# Patient Record
Sex: Female | Born: 1980
Health system: Southern US, Community
[De-identification: ages and names within clinical notes are randomized; demographics above are authoritative.]

## PROBLEM LIST (undated history)

## (undated) DIAGNOSIS — Z789 Other specified health status: Secondary | ICD-10-CM

## (undated) HISTORY — PX: NO PAST SURGERIES: SHX2092

---

## 2019-06-19 DIAGNOSIS — O009 Unspecified ectopic pregnancy without intrauterine pregnancy: Secondary | ICD-10-CM

## 2019-06-19 HISTORY — DX: Unspecified ectopic pregnancy without intrauterine pregnancy: O00.90

## 2019-06-26 ENCOUNTER — Other Ambulatory Visit: Payer: Self-pay

## 2019-06-26 ENCOUNTER — Encounter (HOSPITAL_COMMUNITY): Payer: Self-pay | Admitting: Emergency Medicine

## 2019-06-26 ENCOUNTER — Inpatient Hospital Stay (HOSPITAL_COMMUNITY)
Admission: EM | Admit: 2019-06-26 | Discharge: 2019-06-26 | Disposition: A | Discharge status: Home / Self Care | Payer: 59 | Attending: Obstetrics & Gynecology | Admitting: Obstetrics & Gynecology

## 2019-06-26 ENCOUNTER — Inpatient Hospital Stay (HOSPITAL_COMMUNITY): Payer: 59

## 2019-06-26 DIAGNOSIS — O4690 Antepartum hemorrhage, unspecified, unspecified trimester: Secondary | ICD-10-CM | POA: Diagnosis present

## 2019-06-26 DIAGNOSIS — A599 Trichomoniasis, unspecified: Secondary | ICD-10-CM | POA: Insufficient documentation

## 2019-06-26 DIAGNOSIS — Z3A00 Weeks of gestation of pregnancy not specified: Secondary | ICD-10-CM | POA: Diagnosis not present

## 2019-06-26 DIAGNOSIS — O00201 Right ovarian pregnancy without intrauterine pregnancy: Secondary | ICD-10-CM | POA: Diagnosis not present

## 2019-06-26 DIAGNOSIS — O98319 Other infections with a predominantly sexual mode of transmission complicating pregnancy, unspecified trimester: Secondary | ICD-10-CM | POA: Insufficient documentation

## 2019-06-26 DIAGNOSIS — Z20822 Contact with and (suspected) exposure to covid-19: Secondary | ICD-10-CM | POA: Diagnosis not present

## 2019-06-26 DIAGNOSIS — A5901 Trichomonal vulvovaginitis: Secondary | ICD-10-CM | POA: Diagnosis not present

## 2019-06-26 DIAGNOSIS — O00211 Right ovarian pregnancy with intrauterine pregnancy: Secondary | ICD-10-CM | POA: Insufficient documentation

## 2019-06-26 HISTORY — DX: Other specified health status: Z78.9

## 2019-06-26 LAB — I-STAT BETA HCG BLOOD, ED (MC, WL, AP ONLY): I-stat hCG, quantitative: 1754.1 m[IU]/mL — ABNORMAL HIGH (ref <5)

## 2019-06-26 LAB — CBC
HCT: 45.6 % (ref 36.0–46.0)
Hemoglobin: 15.4 g/dL — ABNORMAL HIGH (ref 12.0–15.0)
MCH: 32.5 pg (ref 26.0–34.0)
MCHC: 33.8 g/dL (ref 30.0–36.0)
MCV: 96.2 fL (ref 80.0–100.0)
Platelets: 283 10*3/uL (ref 150–400)
RBC: 4.74 MIL/uL (ref 3.87–5.11)
RDW: 12.5 % (ref 11.5–15.5)
WBC: 8.8 10*3/uL (ref 4.0–10.5)
nRBC: 0 % (ref 0.0–0.2)

## 2019-06-26 LAB — HEPATIC FUNCTION PANEL
ALT: 14 U/L (ref 0–44)
AST: 19 U/L (ref 15–41)
Albumin: 4.5 g/dL (ref 3.5–5.0)
Alkaline Phosphatase: 36 U/L — ABNORMAL LOW (ref 38–126)
Bilirubin, Direct: 0.1 mg/dL (ref 0.0–0.2)
Indirect Bilirubin: 0.5 mg/dL (ref 0.3–0.9)
Total Bilirubin: 0.6 mg/dL (ref 0.3–1.2)
Total Protein: 7.5 g/dL (ref 6.5–8.1)

## 2019-06-26 LAB — BASIC METABOLIC PANEL
Anion gap: 8 (ref 5–15)
BUN: 17 mg/dL (ref 6–20)
CO2: 25 mmol/L (ref 22–32)
Calcium: 9.2 mg/dL (ref 8.9–10.3)
Chloride: 107 mmol/L (ref 98–111)
Creatinine, Ser: 0.74 mg/dL (ref 0.44–1.00)
GFR calc Af Amer: >60 mL/min (ref >60)
GFR calc non Af Amer: >60 mL/min (ref >60)
Glucose, Bld: 99 mg/dL (ref 70–99)
Potassium: 4.2 mmol/L (ref 3.5–5.1)
Sodium: 140 mmol/L (ref 135–145)

## 2019-06-26 LAB — URINALYSIS, ROUTINE W REFLEX MICROSCOPIC
Bacteria, UA: NONE SEEN
Bilirubin Urine: NEGATIVE
Glucose, UA: NEGATIVE mg/dL
Ketones, ur: NEGATIVE mg/dL
Leukocytes,Ua: NEGATIVE
Nitrite: NEGATIVE
Protein, ur: NEGATIVE mg/dL
Specific Gravity, Urine: 1.008 (ref 1.005–1.030)
pH: 5 (ref 5.0–8.0)

## 2019-06-26 LAB — WET PREP, GENITAL
Clue Cells Wet Prep HPF POC: NONE SEEN
Sperm: NONE SEEN
Yeast Wet Prep HPF POC: NONE SEEN

## 2019-06-26 LAB — ABO/RH: ABO/RH(D): O POS

## 2019-06-26 LAB — RESPIRATORY PANEL BY RT PCR (FLU A&B, COVID)
Influenza A by PCR: NEGATIVE
Influenza B by PCR: NEGATIVE
SARS Coronavirus 2 by RT PCR: NEGATIVE

## 2019-06-26 LAB — PREGNANCY, URINE: Preg Test, Ur: POSITIVE — AB

## 2019-06-26 MED ORDER — OXYCODONE-ACETAMINOPHEN 2.5-325 MG PO TABS: 1.0000 | ORAL_TABLET | ORAL | 0 refills | Status: DC | PRN

## 2019-06-26 MED ORDER — METHOTREXATE FOR ECTOPIC PREGNANCY
50.0000 mg/m2 | Freq: Once | INTRAMUSCULAR | Status: AC
  Administered 2019-06-26: 95 mg via INTRAMUSCULAR
  Filled 2019-06-26: qty 1

## 2019-06-26 MED ORDER — SODIUM CHLORIDE 0.9 % IV BOLUS
1000.0000 mL | Freq: Once | INTRAVENOUS | Status: AC
  Administered 2019-06-26: 13:00:00 1000 mL via INTRAVENOUS

## 2019-06-26 MED ORDER — KETOROLAC TROMETHAMINE 30 MG/ML IJ SOLN
30.0000 mg | Freq: Once | INTRAMUSCULAR | Status: DC
  Filled 2019-06-26: qty 1

## 2019-06-26 MED ORDER — KETOROLAC TROMETHAMINE 30 MG/ML IJ SOLN
30.0000 mg | Freq: Once | INTRAMUSCULAR | Status: AC
  Administered 2019-06-26: 16:00:00 30 mg via INTRAMUSCULAR

## 2019-06-26 MED ORDER — METRONIDAZOLE 500 MG PO TABS: 2000.0000 mg | ORAL_TABLET | Freq: Once | ORAL | 0 refills | Status: AC

## 2019-06-26 NOTE — Discharge Instructions (Signed)
Methotrexate Treatment for an Ectopic Pregnancy, Care After This sheet gives you information about how to care for yourself after your procedure. Your health care provider may also give you more specific instructions. If you have problems or questions, contact your health care provider. What can I expect after the procedure? After the procedure, it is common to have:  Abdominal cramping.  Vaginal bleeding.  Fatigue.  Nausea.  Vomiting.  Diarrhea. Blood tests will be taken at timed intervals for several days or weeks to check your pregnancy hormone levels. The blood tests will be done until the pregnancy hormone can no longer be detected in the blood. Follow these instructions at home: Activity  Do not have sex until your health care provider approves.  Limit activities that take a lot of effort as told by your health care provider. Medicines  Take over the counter and prescription medicines only as told by your health care provider.  Do not take aspirin, ibuprofen, naproxen, or any other NSAIDs.  Do not take folic acid, prenatal vitamins, or other vitamins that contain folic acid. General instructions   Do not drink alcohol.  Follow instructions from your health care provider on how and when to report any symptoms that may indicate a ruptured ectopic pregnancy.  Keep all follow-up visits as told by your health care provider. This is important. Contact a health care provider if:  You have persistent nausea and vomiting.  You have persistent diarrhea.  You are having a reaction to the medicine, such as: ? Tiredness. ? Skin rash. ? Hair loss. Get help right away if:  Your abdominal or pelvic pain gets worse.  You have more vaginal bleeding.  You feel light-headed or you faint.  You have shortness of breath.  Your heart rate increases.  You develop a cough.  You have chills.  You have a fever. Summary  After the procedure, it is common to have symptoms  of abdominal cramping, vaginal bleeding and fatigue. You may also experience other symptoms.  Blood tests will be taken at timed intervals for several days or weeks to check your pregnancy hormone levels. The blood tests will be done until the pregnancy hormone can no longer be detected in the blood.  Limit strenuous activity as told by your health care provider.  Follow instructions from your health care provider on how and when to report any symptoms that may indicate a ruptured ectopic pregnancy. This information is not intended to replace advice given to you by your health care provider. Make sure you discuss any questions you have with your health care provider. Document Revised: 01/17/2017 Document Reviewed: 03/26/2016 Elsevier Patient Education  2020 Elsevier Inc.  

## 2019-06-26 NOTE — ED Notes (Signed)
ED TO INPATIENT HANDOFF REPORT  ED Nurse Name and Phone #: [phone]  S Name/Age/Gender Christy Hickman 39 y.o. female Room/Bed: WA15/WA15  Code Status   Code Status: Not on file  Home/SNF/Other Home Patient oriented to: self, place, time and situation Is this baseline? Yes   Triage Complete: Triage complete  Chief Complaint confirmation of pregnancy  Triage Note Per pt, states her last menstrual cycle was on 3/17, states she had sex on 3/27-states she missed period, took pregnancy test which was positive on 4/17-states she went to have an abortion and they could not find fetus-states she slowly started bleeding and went to [facility], had [place] and they could not see fetus-increased lower abdominal/pelvic pain-was told to come to ED for possible ectopic pregnancy    Allergies No Known Allergies  Level of Care/Admitting Diagnosis ED Disposition    ED Disposition Condition Comment   Transfer to Another Facility  The patient appears reasonably stabilized for transfer considering the current resources, flow, and capabilities available in the ED at this time, and I doubt any other [facility] requiring further screening and/or treatment in the ED prior to transfer is p resent.       B Medical/Surgery History History reviewed. No pertinent past medical history. History reviewed. No pertinent surgical history.   A IV Location/Drains/Wounds Patient Lines/Drains/Airways Status   Active Line/Drains/Airways    Name:   Placement date:   Placement time:   Site:   Days:   Peripheral IV 06/26/19 Left Antecubital   06/26/19    1032    Antecubital   less than 1          Intake/Output Last 24 hours No intake or output data in the 24 hours ending 06/26/19 1223  Labs/Imaging Results for orders placed or performed during the hospital encounter of 06/26/19 (from the past 48 hour(s))  Urinalysis, Routine w reflex microscopic     Status: Abnormal   Collection Time: 06/26/19 10:31 AM   Result Value Ref Range   Color, Urine STRAW (A) YELLOW   APPearance CLEAR CLEAR   Specific Gravity, Urine 1.008 1.005 - 1.030   pH 5.0 5.0 - 8.0   Glucose, UA NEGATIVE NEGATIVE mg/dL   Hgb urine dipstick LARGE (A) NEGATIVE   Bilirubin Urine NEGATIVE NEGATIVE   Ketones, ur NEGATIVE NEGATIVE mg/dL   Protein, ur NEGATIVE NEGATIVE mg/dL   Nitrite NEGATIVE NEGATIVE   Leukocytes,Ua NEGATIVE NEGATIVE   RBC / HPF 21-50 0 - 5 RBC/hpf   WBC, UA 0-5 0 - 5 WBC/hpf   Bacteria, UA NONE SEEN NONE SEEN   Squamous Epithelial / LPF 0-5 0 - 5   Mucus PRESENT     Comment: Performed at [facility], [address] [place], [place] [address]  Pregnancy, urine     Status: Abnormal   Collection Time: 06/26/19 10:31 AM  Result Value Ref Range   Preg Test, Ur POSITIVE (A) NEGATIVE    Comment:        THE SENSITIVITY OF THIS METHODOLOGY IS >20 mIU/mL. Performed at [facility], [address] [place], [place] [address]   CBC     Status: Abnormal   Collection Time: 06/26/19 10:31 AM  Result Value Ref Range   WBC 8.8 4.0 - 10.5 K/uL   RBC 4.74 3.87 - 5.11 MIL/uL   Hemoglobin 15.4 (H) 12.0 - 15.0 g/dL   HCT 49.7 02.6 - 37.8 %   MCV 96.2 80.0 - 100.0 fL  MCH 32.5 26.0 - 34.0 pg   MCHC 33.8 30.0 - 36.0 g/dL   RDW 03.5 00.9 - 38.1 %   Platelets 283 150 - 400 K/uL   nRBC 0.0 0.0 - 0.2 %    Comment: Performed at [facility], [address] [place], [place] [address]  Basic metabolic panel     Status: None   Collection Time: 06/26/19 10:31 AM  Result Value Ref Range   Sodium 140 135 - 145 mmol/L   Potassium 4.2 3.5 - 5.1 mmol/L   Chloride 107 98 - 111 mmol/L   CO2 25 22 - 32 mmol/L   Glucose, Bld 99 70 - 99 mg/dL    Comment: Glucose reference range applies only to samples taken after fasting for at least 8 hours.   BUN 17 6 - 20 mg/dL   Creatinine, Ser 7.16 0.44 - 1.00 mg/dL   Calcium 9.2 8.9 - 96.7 mg/dL   GFR calc non Af Amer >60 >60  mL/min   GFR calc Af Amer >60 >60 mL/min   Anion gap 8 5 - 15    Comment: Performed at [facility], [address], [place], [place] [address]  I-Stat beta hCG blood, ED     Status: Abnormal   Collection Time: 06/26/19 10:36 AM  Result Value Ref Range   I-stat hCG, quantitative 1,754.1 (H) <5 mIU/mL   Comment 3            Comment:   GEST. AGE      CONC.  (mIU/mL)   <=1 WEEK        5 - 50     2 WEEKS       50 - 500     3 WEEKS       100 - 10,000     4 WEEKS     1,000 - 30,000        FEMALE AND NON-PREGNANT FEMALE:     LESS THAN 5 mIU/mL    No results found.  Pending Labs Unresulted Labs (From admission, onward)    Start     Ordered   06/26/19 1122  Hepatic function panel  Add-on,   AD     06/26/19 1121   06/26/19 1122  Respiratory Panel by RT PCR (Flu A&B, Covid) - Nasopharyngeal Swab  (Tier 2 Respiratory Panel by RT PCR (Flu A&B, Covid) (TAT 2 hrs))  Once,   STAT    Question Answer Comment  Is this test for diagnosis or screening Screening   Symptomatic for COVID-19 as defined by CDC No   Hospitalized for COVID-19 No   Admitted to ICU for COVID-19 No   Previously tested for COVID-19 Yes   Resident in a congregate (group) care setting No   Employed in healthcare setting No   Pregnant No   Has patient completed COVID vaccination(s) (2 doses of Pfizer/Moderna 1 dose of [employer]) No      06/26/19 1121   06/26/19 1108  ABO/Rh  Once,   STAT     06/26/19 1107          Vitals/Pain Today's Vitals   06/26/19 0928 06/26/19 0934 06/26/19 1106 06/26/19 1211  BP: 138/87  114/77 100/66  Pulse: 94  75 63  Resp: 18  18 18   Temp: 98.4 F (36.9 C)     TempSrc: Oral     SpO2: 100%  98% 95%  Weight: 79.4 kg     Height:  5\' 7"  (1.702 m)     PainSc:  8       Isolation Precautions No active isolations  Medications Medications - No data to display  Mobility walks Low fall risk   Focused Assessments Possible ectopic  pregnancy.   R Recommendations: See Admitting Provider Note  Report given to:   Additional Notes: n/a

## 2019-06-26 NOTE — MAU Provider Note (Signed)
Patient Christy Hickman is a 39 y.o. [id number]  [redacted]w[redacted]d here with complaints of vaginal bleeding and pain. She reports a positive pregnancy on 06-04-2019. She started bleeding the 18; she had an [place] at [facility] that showed nothing in the uterus and it was recommended that she come back for a follow up US in two weeks. Patient then had follow up [facility] this morning at a [facility] and still nothing in the uterus  But with complaints of cramping and bleeding that had been on-going for two weeks.   She denies fever, SOB, nv, she reports that she feels her vagina hurts her and she has lower abdominal pain. She does not like hospitals and did not want to come to be seen until [facility] told her she had to come to the ED. She went to [facility] and then was transferred here via [facility] at 1330.  History     CSN: [id number]  Arrival date and time: 06/26/19 1340   First Provider Initiated Contact with Patient 06/26/19 1401      Chief Complaint  Patient presents with  . Vaginal Bleeding  . Abdominal Pain   Abdominal Pain This is a new problem. The pain is at a severity of 10/10. The quality of the pain is a sensation of fullness and cramping. The abdominal pain radiates to the back. Pertinent negatives include no constipation, diarrhea, dysuria, fever, nausea or vomiting. The pain is aggravated by being still. The pain is relieved by movement.  Vaginal Bleeding The patient's primary symptoms include vaginal bleeding. This is a chronic problem. The current episode started 1 to 4 weeks ago. The problem occurs intermittently. The problem has been gradually worsening. Associated symptoms include abdominal pain. Pertinent negatives include no constipation, diarrhea, dysuria, fever, nausea or vomiting. The vaginal discharge was bloody. The vaginal bleeding is heavier than menses. She has been passing clots. She has been passing tissue. Nothing aggravates the symptoms. She has tried nothing for the  symptoms.    OB History    Gravida  4   Para  2   Term  2   Preterm      AB  1   Living  2     SAB      TAB  1   Ectopic      Multiple      Live Births              Past Medical History:  Diagnosis Date  . Medical history non-contributory     Past Surgical History:  Procedure Laterality Date  . NO PAST SURGERIES      No family history on file.  Social History   Tobacco Use  . Smoking status: Not on file  Substance Use Topics  . Alcohol use: Not on file  . Drug use: Not on file    Allergies: No Known Allergies  No medications prior to admission.    Review of Systems  Constitutional: Negative for fever.  Gastrointestinal: Positive for abdominal pain. Negative for constipation, diarrhea, nausea and vomiting.  Genitourinary: Positive for vaginal bleeding. Negative for dysuria.   Physical Exam   Blood pressure 123/73, pulse 77, temperature 98.2 F (36.8 C), temperature source Oral, resp. rate 17, height 5\' 7"  (1.702 m), weight 79.4 kg, last menstrual period 05/05/2019, SpO2 100 %.  Physical Exam  Constitutional: She is oriented to person, place, and time. She appears well-developed.  Respiratory: Effort normal.  GI: Soft. There is  abdominal tenderness.  Genitourinary:    Genitourinary Comments: NEFG; trace amount of dark brown blood in the vagina, cervix is non-tender, slight tenderness over left adnexa on exam   Musculoskeletal:        General: Normal range of motion.     Cervical back: Normal range of motion.  Neurological: She is alert and oriented to person, place, and time.  Skin: Skin is warm and dry.    MAU Course  Procedures  MDM -Beta drawn at [facility] is 1745; records reviewed from [facility] visit; lab work and provider notes reviewed from [facility] visit this morning.   -US shows right adnexal mass, concerning for ectopic.  I have independently reviewed the [place] images, which reveal finding of right adnexal mass, concerning for ectopic.  Discussed with Dr. Vevelyn Francois, who agrees that methotrexate is appropriate action.  -Hgb is stable, CMP is normal.  -Discussed with patient the diagnosis of ectopic pregnancy, implications for viability and her future health and mortality and morbidty -Patient agrees to methotrexate; will also take pain medicine.   Assessment and Plan   1. Right ovarian pregnancy without intrauterine pregnancy   2. Vaginal bleeding in pregnancy   3. Trichomoniasis    2. Patient stable for discharge. Clear instructions given on worsening pain, heavier bleeding, and plan to return to MAU. Patient discharged with RX for Flagyl for trich (preferes to take at home as she is anxious to get back to her children).  3. RX for pain meds given, plus stat BHCG appt at [facility] made for Day # 4 labs. Patient knows the time of the appointment and the location and she knows she will have to wait for phone call after labs come back (2 hour turnaround time) 4. Work note written so that she can take this week off for her follow up visits on Tuesday and Friday. She was counseled about possible rise in BHCG between now (Day # 1) and Tuesday (Day #4) and then an expected drop between Day#4 and Day # 7.  She knows she will need follow up after that time.  5. Precautions about abstinence until trich and ectopic pregnancy have been resolved; all questions answered.  Mervyn Skeeters Amyah Clawson 06/26/2019, 3:57 PM

## 2019-06-26 NOTE — ED Notes (Signed)
Called and notified Charge RN Joni Reining that pt will be transferred after coordinating transport with [facility].

## 2019-06-26 NOTE — ED Triage Notes (Signed)
Per pt, states her last menstrual cycle was on 3/17, states she had sex on 3/27-states she missed period, took pregnancy test which was positive on 4/17-states she went to have an abortion and they could not find fetus-states she slowly started bleeding and went to [facility], had [place] and they could not see fetus-increased lower abdominal/pelvic pain-was told to come to ED for possible ectopic pregnancy

## 2019-06-26 NOTE — ED Provider Notes (Signed)
[facility] DEPT Provider Note   CSN: [id number] Arrival date & time: 06/26/19  1340     History Chief Complaint  Patient presents with  . Vaginal Bleeding    Christy Hickman is a 39 y.o. female G4P2 with no significant past medical history presents the ED with complaints of pelvic pain and vaginal bleeding in the setting of pregnancy.  Patient reports that she took a home pregnancy test on 06/05/2019 and was found to be pregnant.  She went to an abortion clinic, but they could not find any intrauterine pregnancy and they suspected that she was "too early" encouraged her to come back on 06/26/2019.  Patient states that since her initial visit, she has been experiencing progressively worsening pelvic/pubic abdominal discomfort in addition to vaginal bleeding.  Upon returning to the clinic as scheduled, they once again were unable to detect intrauterine pregnancy.  However, her UA was once again pregnant and she was advised to come to the ED emergently for ectopic work-up.     HPI     History reviewed. No pertinent past medical history.  There are no problems to display for this patient.   History reviewed. No pertinent surgical history.   OB History   No obstetric history on file.     No family history on file.  Social History   Tobacco Use  . Smoking status: Not on file  Substance Use Topics  . Alcohol use: Not on file  . Drug use: Not on file    Home Medications Prior to Admission medications   Not on File    Allergies    Patient has no known allergies.  Review of Systems   Review of Systems  Constitutional: Negative for chills and fever.  Gastrointestinal: Negative for abdominal pain, nausea and vomiting.  Genitourinary: Positive for pelvic pain, vaginal bleeding and vaginal pain.    Physical Exam Updated Vital Signs BP (!) 95/51   Pulse 77   Temp 98.4 F (36.9 C) (Oral)   Resp 18   Ht 5\' 7"  (1.702 m)   Wt 79.4 kg   SpO2 100%    BMI 27.41 kg/m   Physical Exam Vitals and nursing note reviewed. Exam conducted with a chaperone present.  HENT:     Head: Normocephalic and atraumatic.  Eyes:     General: No scleral icterus.    Conjunctiva/sclera: Conjunctivae normal.  Cardiovascular:     Rate and Rhythm: Normal rate and regular rhythm.     Pulses: Normal pulses.     Heart sounds: Normal heart sounds.  Pulmonary:     Effort: Pulmonary effort is normal. No respiratory distress.     Breath sounds: Normal breath sounds.  Abdominal:     Comments: Soft, nondistended.  TTP in lower abdomen, most notably over suprapubic region.  No overlying skin changes.  Normoactive bowel sounds.  Musculoskeletal:     Cervical back: Normal range of motion and neck supple.     Right lower leg: No edema.     Left lower leg: No edema.  Skin:    General: Skin is dry.     Capillary Refill: Capillary refill takes less than 2 seconds.  Neurological:     Mental Status: She is alert and oriented to person, place, and time.     GCS: GCS eye subscore is 4. GCS verbal subscore is 5. GCS motor subscore is 6.  Psychiatric:        Mood and Affect: Mood normal.  Behavior: Behavior normal.        Thought Content: Thought content normal.     ED Results / Procedures / Treatments   Labs (all labs ordered are listed, but only abnormal results are displayed) Labs Reviewed  URINALYSIS, ROUTINE W REFLEX MICROSCOPIC - Abnormal; Notable for the following components:      Result Value   Color, Urine STRAW (*)    Hgb urine dipstick LARGE (*)    All other components within normal limits  PREGNANCY, URINE - Abnormal; Notable for the following components:   Preg Test, Ur POSITIVE (*)    All other components within normal limits  CBC - Abnormal; Notable for the following components:   Hemoglobin 15.4 (*)    All other components within normal limits  I-STAT BETA HCG BLOOD, ED ([facility], [facility], [facility] ONLY) - Abnormal; Notable for the following components:    I-stat hCG, quantitative 1,754.1 (*)    All other components within normal limits  RESPIRATORY PANEL BY RT PCR (FLU A&B, COVID)  BASIC METABOLIC PANEL  HEPATIC FUNCTION PANEL  ABO/RH    EKG None  Radiology No results found.  Procedures Procedures (including critical care time)  Medications Ordered in ED Medications  sodium chloride 0.9 % bolus 1,000 mL (1,000 mLs Intravenous New Bag/Given 06/26/19 1307)    ED Course  I have reviewed the triage vital signs and the nursing notes.  Pertinent labs & imaging results that were available during my care of the patient were reviewed by me and considered in my medical decision making (see chart for details).  Clinical Course as of Jun 25 1341  Sat Jun 26, 2019  1121 Spoke with Dr. Lavina Hamman OB/GYN who is recommending transfer to [facility] for ultrasound to assess for ectopic pregnancy.  Will add on hepatic function panel as patient will request methotrexate regardless as this is not a desired pregnancy.   [GG]    Clinical Course User Index [GG] Elvera Maria   MDM Rules/Calculators/A&P                      Spoke with Dr. Lavina Hamman OB/GYN who is recommending transfer to [facility] for ultrasound to assess for ectopic pregnancy.  Will add on hepatic function panel as patient will request methotrexate regardless as this is not a desired pregnancy.  Spoke with Lauris Poag, MAU APP who will accept patient to [facility].  Spoke with our nursing staff who will arrange for transport.    [facility] finally arrives for transport.  Patient reassessed and continues to be in no acute distress. Stable for transport to MAU.  Discussed with Dr. Jacqulyn Bath who agrees with assessment and plan.    Final Clinical Impression(s) / ED Diagnoses Final diagnoses:  Vaginal bleeding in pregnancy    Rx / DC Orders ED Discharge Orders    None       Lorelee New, PA-C 06/26/19 1343    Long, Arlyss Repress, MD 06/27/19 [phone]

## 2019-06-26 NOTE — MAU Note (Addendum)
Christy Hickman is a 39 y.o. at [redacted]w[redacted]d here in MAU reporting:  Arrived via [facility] from [facility] ED LMP: 05/05/19. Reports that she had unprotected sex on 05/15/19.  +HPT on 06/05/19 Patient states she wanted to have an abortion and went to [facility] on 06/[facility]. There they did not see anything on ultrasound.  Has been having lower abdominal pain and vaginal bleeding that has continued to increase since 06/06/19. Onset of complaint: 06/06/19 Pain score: 5-6/10 Vitals:   06/26/19 1313 06/26/19 1337  BP: (!) 95/51 123/73  Pulse: 77 77  Resp: 18 17  Temp:  98.2 F (36.8 C)  SpO2: 100% 100%   20G IV noted in Left AC infusing with 0.9% Normal Saline. Lab orders placed from triage: none

## 2019-06-28 LAB — GC/CHLAMYDIA PROBE AMP (~~LOC~~) NOT AT ARMC
Chlamydia: NEGATIVE
Comment: NEGATIVE
Comment: NORMAL
Neisseria Gonorrhea: NEGATIVE

## 2019-06-29 ENCOUNTER — Ambulatory Visit (INDEPENDENT_AMBULATORY_CARE_PROVIDER_SITE_OTHER): Payer: 59 | Admitting: *Deleted

## 2019-06-29 ENCOUNTER — Other Ambulatory Visit: Payer: 59

## 2019-06-29 ENCOUNTER — Other Ambulatory Visit: Payer: Self-pay

## 2019-06-29 DIAGNOSIS — O00101 Right tubal pregnancy without intrauterine pregnancy: Secondary | ICD-10-CM

## 2019-06-29 DIAGNOSIS — O3680X0 Pregnancy with inconclusive fetal viability, not applicable or unspecified: Secondary | ICD-10-CM

## 2019-06-29 DIAGNOSIS — Z113 Encounter for screening for infections with a predominantly sexual mode of transmission: Secondary | ICD-10-CM

## 2019-06-29 LAB — BETA HCG QUANT (REF LAB): hCG Quant: 1480 m[IU]/mL

## 2019-06-29 NOTE — Progress Notes (Signed)
Received results of stat bhcg.Reviewed with Dr.Davis. Called [facility] and notified her of results of 1480 today. Explained with continue with plan of repeat stat bhcg on Friday 07/02/19 which will be day 7 after methrotrexate. Instructed her to go to the hospital if she has severe pain or heavy bleeding. She voices understanding. Dewey Viens,RN

## 2019-06-29 NOTE — Progress Notes (Signed)
Here for Stat Bhcg.  States bleeding stopped mostly on 06/27/19- only having occasional spotting. States still having same intermittent pain in her lower back  Lower pelvis=5  Explained we will draw stat bhcg and have her leave; but we will call her with  Results in about 2 hours after they are reviewed with provider and then call her. She voices understanding. She also asked to have std testing hiv and rpr.  Jetty Berland,RN

## 2019-06-30 LAB — HIV ANTIBODY (ROUTINE TESTING W REFLEX): HIV Screen 4th Generation wRfx: NONREACTIVE

## 2019-06-30 LAB — RPR: RPR Ser Ql: NONREACTIVE

## 2019-06-30 NOTE — Progress Notes (Signed)
I have reviewed this chart and agree with the RN/CMA assessment and management.    K. Meryl Kealey Kemmer, M.D. Attending [facility] (Faculty Practice)   

## 2019-07-01 LAB — SPECIMEN STATUS REPORT

## 2019-07-01 LAB — BETA HCG QUANT (REF LAB): hCG Quant: 1392 m[IU]/mL

## 2019-07-02 ENCOUNTER — Inpatient Hospital Stay (HOSPITAL_COMMUNITY): Payer: 59

## 2019-07-02 ENCOUNTER — Other Ambulatory Visit: Payer: Self-pay

## 2019-07-02 ENCOUNTER — Encounter: Payer: Self-pay | Admitting: *Deleted

## 2019-07-02 ENCOUNTER — Encounter (HOSPITAL_COMMUNITY): Payer: Self-pay | Admitting: Obstetrics & Gynecology

## 2019-07-02 ENCOUNTER — Inpatient Hospital Stay (HOSPITAL_COMMUNITY)
Admission: AD | Admit: 2019-07-02 | Discharge: 2019-07-02 | Disposition: A | Discharge status: Home / Self Care | Payer: 59 | Attending: Obstetrics & Gynecology | Admitting: Obstetrics & Gynecology

## 2019-07-02 ENCOUNTER — Ambulatory Visit (INDEPENDENT_AMBULATORY_CARE_PROVIDER_SITE_OTHER): Payer: 59 | Admitting: *Deleted

## 2019-07-02 VITALS — BP 118/70 | HR 77 | Temp 98.5°F

## 2019-07-02 DIAGNOSIS — Z8759 Personal history of other complications of pregnancy, childbirth and the puerperium: Secondary | ICD-10-CM | POA: Insufficient documentation

## 2019-07-02 DIAGNOSIS — F17210 Nicotine dependence, cigarettes, uncomplicated: Secondary | ICD-10-CM | POA: Insufficient documentation

## 2019-07-02 DIAGNOSIS — G44209 Tension-type headache, unspecified, not intractable: Secondary | ICD-10-CM | POA: Diagnosis not present

## 2019-07-02 DIAGNOSIS — O009 Unspecified ectopic pregnancy without intrauterine pregnancy: Secondary | ICD-10-CM | POA: Insufficient documentation

## 2019-07-02 DIAGNOSIS — O00101 Right tubal pregnancy without intrauterine pregnancy: Secondary | ICD-10-CM

## 2019-07-02 DIAGNOSIS — O3680X0 Pregnancy with inconclusive fetal viability, not applicable or unspecified: Secondary | ICD-10-CM

## 2019-07-02 DIAGNOSIS — O00102 Left tubal pregnancy without intrauterine pregnancy: Secondary | ICD-10-CM

## 2019-07-02 LAB — HCG, QUANTITATIVE, PREGNANCY: hCG, Beta Chain, Quant, S: 637 m[IU]/mL — ABNORMAL HIGH (ref <5)

## 2019-07-02 LAB — CBC
HCT: 41.9 % (ref 36.0–46.0)
Hemoglobin: 13.9 g/dL (ref 12.0–15.0)
MCH: 31.6 pg (ref 26.0–34.0)
MCHC: 33.2 g/dL (ref 30.0–36.0)
MCV: 95.2 fL (ref 80.0–100.0)
Platelets: 287 10*3/uL (ref 150–400)
RBC: 4.4 MIL/uL (ref 3.87–5.11)
RDW: 12.5 % (ref 11.5–15.5)
WBC: 7 10*3/uL (ref 4.0–10.5)
nRBC: 0 % (ref 0.0–0.2)

## 2019-07-02 LAB — BETA HCG QUANT (REF LAB): hCG Quant: 507 m[IU]/mL

## 2019-07-02 MED ORDER — BUTALBITAL-APAP-CAFFEINE 50-325-40 MG PO TABS: 1.0000 | ORAL_TABLET | Freq: Four times a day (QID) | ORAL | 0 refills | Status: AC | PRN

## 2019-07-02 MED ORDER — BUTALBITAL-APAP-CAFFEINE 50-325-40 MG PO TABS
2.0000 | ORAL_TABLET | Freq: Four times a day (QID) | ORAL | Status: DC | PRN
  Administered 2019-07-02: 2 via ORAL
  Filled 2019-07-02: qty 2

## 2019-07-02 NOTE — Progress Notes (Signed)
Chart reviewed - agree with CMA/RN documentation.  ° °

## 2019-07-02 NOTE — Progress Notes (Signed)
Pt here for stat BHCG - Arush Gatliff 7 after MTX. She reports increased abdominal/pelvic pain since MAU visit on 5/8. She called and spoke with midwife on 5/12 and was told to come to hospital. She decided not to go and just continue taking Oxycodone for the pain.  - "to see if I could push through it". Pt states she has only 2 tablets left and requests refill. Today she reports having a severe H/A which is new. She also has pain in her back as well. She took Oxycodone @ 0400 due to H/A - no relief. Stat BHCG drawn. Pt status discussed with Dr. Adrian Blackwater who recommends pt go immediately to MAU for [place] and further evaluation. Pt was advised of recommended plan of care. She voiced understanding and agreed.

## 2019-07-02 NOTE — MAU Note (Signed)
Sent from clinic, today day 7 post MTX. Received on 5/8. Wed had severe pain. Today is not as bad, RLQ and woke up with a bad HA. Has been bleeding since 4/18, bleeding increased on Wed, and had small clots.  Less bleeding today. Feeling bloated, pressure.  Took oxycodone at 0400.

## 2019-07-02 NOTE — MAU Provider Note (Addendum)
History     CSN: [id number]  Arrival date and time: 07/02/19 1034   First Provider Initiated Contact with Patient 07/02/19 1125      Chief Complaint  Patient presents with  . Headache  . Abdominal Pain   39 y.o. [id number] with known right ectopic s/p MTX 7 days ago presenting with pain. Reports 2 episodes of severe abdominal pain 2 days ago. She took 2 Oxycodone and pain improved. She also had nausea, SOB, and lightheadedness at that time. The pain improved yesterday and today rates it 5/10. Reports using 1 Oxycodone q4 hours. Also c/o frontal and occipital HA that started this am. She took Oxycodone but it didn't help her HA. No nausea or visual changes. She is having light VB.   OB History    Gravida  4   Para  2   Term  2   Preterm      AB  1   Living  2     SAB      TAB  1   Ectopic      Multiple      Live Births              Past Medical History:  Diagnosis Date  . Ectopic pregnancy 06/2019   Methotrexate  . Medical history non-contributory     Past Surgical History:  Procedure Laterality Date  . NO PAST SURGERIES      Family History  Problem Relation Age of Onset  . Healthy Mother   . Healthy Father     Social History   Tobacco Use  . Smoking status: Current Every Day Smoker    Packs/day: 0.50    Years: 24.00    Pack years: 12.00    Types: Cigarettes  . Smokeless tobacco: Never Used  Substance Use Topics  . Alcohol use: Not Currently  . Drug use: Never    Allergies: No Known Allergies  No medications prior to admission.    Review of Systems  Constitutional: Negative for fever.  Eyes: Negative for visual disturbance.  Gastrointestinal: Positive for abdominal pain. Negative for nausea and vomiting.  Genitourinary: Positive for vaginal bleeding.  Neurological: Positive for headaches. Negative for dizziness and light-headedness.   Physical Exam   Blood pressure 99/63, pulse 61, temperature 99.4 F (37.4 C), temperature  source Oral, resp. rate 16, height 5\' 7"  (1.702 m), weight 80.2 kg, last menstrual period 05/05/2019, SpO2 99 %.  Physical Exam  Nursing note and vitals reviewed. Constitutional: She is oriented to person, place, and time. She appears well-developed and well-nourished. No distress.  HENT:  Head: Normocephalic and atraumatic.  Cardiovascular: Normal rate.  Respiratory: Effort normal. No respiratory distress.  GI: Soft. She exhibits no distension and no mass. There is abdominal tenderness (mild) in the periumbilical area. There is no rebound and no guarding.  Musculoskeletal:        General: Normal range of motion.     Cervical back: Normal range of motion.  Neurological: She is alert and oriented to person, place, and time.  Skin: Skin is warm and dry.  Psychiatric: She has a normal mood and affect.   Results for orders placed or performed during the hospital encounter of 07/02/19 (from the past 24 hour(s))  CBC     Status: None   Collection Time: 07/02/19 11:00 AM  Result Value Ref Range   WBC 7.0 4.0 - 10.5 K/uL   RBC 4.40 3.87 - 5.11 MIL/uL   Hemoglobin 13.9  12.0 - 15.0 g/dL   HCT 41.9 36.0 - 46.0 %   MCV 95.2 80.0 - 100.0 fL   MCH 31.6 26.0 - 34.0 pg   MCHC 33.2 30.0 - 36.0 g/dL   RDW 12.5 11.5 - 15.5 %   Platelets 287 150 - 400 K/uL   nRBC 0.0 0.0 - 0.2 %  hCG, quantitative, pregnancy     Status: Abnormal   Collection Time: 07/02/19 11:00 AM  Result Value Ref Range   hCG, Beta Chain, Quant, S 637 (H) <5 mIU/mL   US OB Transvaginal  Result Date: 07/02/2019 CLINICAL DATA:  Possible ectopic pregnancy. EXAM: TRANSVAGINAL OB ULTRASOUND TECHNIQUE: Transvaginal ultrasound was performed for complete evaluation of the gestation as well as the maternal uterus, adnexal regions, and pelvic cul-de-sac. COMPARISON:  Jun 26, 2019. FINDINGS: Intrauterine gestational sac: None Yolk sac:  Not Visualized. Embryo:  Not Visualized. Cardiac Activity: Not Visualized. Subchorionic hemorrhage:  None  visualized. Maternal uterus/adnexae: Ovaries are unremarkable. Trace free fluid is noted. Heterogeneous mass is seen adjacent to right ovary that measures 2.3 x 2.0 cm by my own measurement and appears to be slightly enlarged compared to prior exam. It does not demonstrate significantly increased vascularity on Doppler. IMPRESSION: No evidence of intrauterine gestation is noted. 2.3 x 2.0 cm heterogeneous right adnexal mass is seen adjacent to right ovary which may be slightly enlarged compared to prior exam. This remains suspicious for ectopic pregnancy. Reportedly the patient has already received methotrexate therapy. A significant amount of pelvic free fluid is not identified. These results were called by telephone at the time of interpretation on 07/02/2019 at 12:02 pm to provider [facility] , who verbally acknowledged these results. Electronically Signed   By: Marijo Conception M.D.   On: 07/02/2019 12:03   MAU Course  Procedures Meds ordered this encounter  Medications  . butalbital-acetaminophen-caffeine (FIORICET) 50-325-40 MG per tablet 2 tablet  . butalbital-acetaminophen-caffeine (FIORICET) 50-325-40 MG tablet    Sig: Take 1-2 tablets by mouth every 6 (six) hours as needed for headache.    Dispense:  14 tablet    Refill:  0    Order Specific Question:   Supervising Provider    Answer:   Woodroe Mode [[id number]]   MDM Labs and [place] ordered and reviewed. No evidence of ectopic rupture, and qhcg dropping appropriately. Discussed hx, presentation, and findings with Dr. Roselie Awkward, plan for routine f/u in 1 week. HA improved, likely tension. Pt requesting another Oxycodone Rx which I do not recommend and informed pt if pain is severe she would need to be evaluated as she is still at risk for rupture. Stable for discharge home.   Assessment and Plan  Right ectopic pregnancy Tension headache Discharge home Follow up at [facility] in 1 week for qhcg- message sent to schedule Return precautions Rx  Fioricet  Allergies as of 07/02/2019   No Known Allergies     Medication List    STOP taking these medications   oxycodone-acetaminophen 2.5-325 MG tablet Commonly known as: Percocet     TAKE these medications   butalbital-acetaminophen-caffeine 50-325-40 MG tablet Commonly known as: FIORICET Take 1-2 tablets by mouth every 6 (six) hours as needed for headache.      Julianne Handler, CNM 07/02/2019, 4:05 PM

## 2019-07-02 NOTE — Discharge Instructions (Signed)
Methotrexate Treatment for an Ectopic Pregnancy, Care After This sheet gives you information about how to care for yourself after your procedure. Your health care provider may also give you more specific instructions. If you have problems or questions, contact your health care provider. What can I expect after the procedure? After the procedure, it is common to have:  Abdominal cramping.  Vaginal bleeding.  Fatigue.  Nausea.  Vomiting.  Diarrhea. Blood tests will be taken at timed intervals for several days or weeks to check your pregnancy hormone levels. The blood tests will be done until the pregnancy hormone can no longer be detected in the blood. Follow these instructions at home: Activity  Do not have sex until your health care provider approves.  Limit activities that take a lot of effort as told by your health care provider. Medicines  Take over the counter and prescription medicines only as told by your health care provider.  Do not take aspirin, ibuprofen, naproxen, or any other NSAIDs.  Do not take folic acid, prenatal vitamins, or other vitamins that contain folic acid. General instructions   Do not drink alcohol.  Follow instructions from your health care provider on how and when to report any symptoms that may indicate a ruptured ectopic pregnancy.  Keep all follow-up visits as told by your health care provider. This is important. Contact a health care provider if:  You have persistent nausea and vomiting.  You have persistent diarrhea.  You are having a reaction to the medicine, such as: ? Tiredness. ? Skin rash. ? Hair loss. Get help right away if:  Your abdominal or pelvic pain gets worse.  You have more vaginal bleeding.  You feel light-headed or you faint.  You have shortness of breath.  Your heart rate increases.  You develop a cough.  You have chills.  You have a fever. Summary  After the procedure, it is common to have symptoms  of abdominal cramping, vaginal bleeding and fatigue. You may also experience other symptoms.  Blood tests will be taken at timed intervals for several days or weeks to check your pregnancy hormone levels. The blood tests will be done until the pregnancy hormone can no longer be detected in the blood.  Limit strenuous activity as told by your health care provider.  Follow instructions from your health care provider on how and when to report any symptoms that may indicate a ruptured ectopic pregnancy. This information is not intended to replace advice given to you by your health care provider. Make sure you discuss any questions you have with your health care provider. Document Revised: 01/17/2017 Document Reviewed: 03/26/2016 Elsevier Patient Education  2020 Elsevier Inc.   Tension Headache, Adult A tension headache is pain, pressure, or aching in your head. Tension headaches can last from 30 minutes to several days. Follow these instructions at home: Managing pain  Take over-the-counter and prescription medicines only as told by your doctor.  When you have a headache, lie down in a dark, quiet room.  If told, put ice on your head and neck: ? Put ice in a plastic bag. ? Place a towel between your skin and the bag. ? Leave the ice on for 20 minutes, 2-3 times a day.  If told, put heat on the back of your neck. Do this as often as your doctor tells you to. Use the kind of heat that your doctor recommends, such as a moist heat pack or a heating pad. ? Place a towel  between your skin and the heat. ? Leave the heat on for 20-30 minutes. ? Remove the heat if your skin turns bright red. Eating and drinking  Eat meals on a regular schedule.  Watch how much alcohol you drink: ? If you are a woman and are not pregnant, do not drink more than 1 drink a day. ? If you are a man, do not drink more than 2 drinks a day.  Drink enough fluid to keep your pee (urine) pale yellow.  Do not use a  lot of caffeine, or stop using caffeine. Lifestyle  Get enough sleep. Get 7-9 hours of sleep each night. Or get the amount of sleep that your doctor tells you to.  At bedtime, remove all electronic devices from your room. Examples of electronic devices are computers, phones, and tablets.  Find ways to lessen your stress. Some things that can lessen stress are: ? Exercise. ? Deep breathing. ? Yoga. ? Music. ? Positive thoughts.  Sit up straight. Do not tighten (tense) your muscles.  Do not use any products that have nicotine or tobacco in them, such as cigarettes and e-cigarettes. If you need help quitting, ask your doctor. General instructions   Keep all follow-up visits as told by your doctor. This is important.  Avoid things that can bring on headaches. Keep a journal to find out if certain things bring on headaches. For example, write down: ? What you eat and drink. ? How much sleep you get. ? Any change to your diet or medicines. Contact a doctor if:  Your headache does not get better.  Your headache comes back.  You have a headache and sounds, light, or smells bother you.  You feel sick to your stomach (nauseous) or you throw up (vomit).  Your stomach hurts. Get help right away if:  You suddenly get a very bad headache along with any of these: ? A stiff neck. ? Feeling sick to your stomach. ? Throwing up. ? Feeling weak. ? Trouble seeing. ? Feeling short of breath. ? A rash. ? Feeling unusually sleepy. ? Trouble speaking. ? Pain in your eye or ear. ? Trouble walking or balancing. ? Feeling like you will pass out (faint). ? Passing out. Summary  A tension headache is pain, pressure, or aching in your head.  Tension headaches can last from 30 minutes to several days.  Lifestyle changes and medicines may help relieve pain. This information is not intended to replace advice given to you by your health care provider. Make sure you discuss any questions you  have with your health care provider. Document Revised: 12/02/2018 Document Reviewed: 05/17/2016 Elsevier Patient Education  [place].

## 2019-07-05 ENCOUNTER — Telehealth: Payer: Self-pay | Admitting: Family Medicine

## 2019-07-05 NOTE — Telephone Encounter (Signed)
Attempted to contact patient with her lab work appointments. No answer, left voicemail with the appointment information. Patient instructed to give the office a call back if she needs to reschedule.

## 2019-07-08 ENCOUNTER — Telehealth (INDEPENDENT_AMBULATORY_CARE_PROVIDER_SITE_OTHER): Payer: 59 | Admitting: Lactation Services

## 2019-07-08 DIAGNOSIS — O00109 Unspecified tubal pregnancy without intrauterine pregnancy: Secondary | ICD-10-CM

## 2019-07-08 NOTE — Telephone Encounter (Signed)
Called patient to give HCG results and follow up recommendations of weekly Hcg appointments. Patient did not answer. Asked patient to check My chart and to make sure she calls the office for information.

## 2019-07-09 ENCOUNTER — Other Ambulatory Visit: Payer: Self-pay | Admitting: General Practice

## 2019-07-09 ENCOUNTER — Other Ambulatory Visit: Payer: Self-pay

## 2019-07-09 ENCOUNTER — Other Ambulatory Visit: Payer: 59

## 2019-07-09 DIAGNOSIS — O00101 Right tubal pregnancy without intrauterine pregnancy: Secondary | ICD-10-CM

## 2019-07-10 LAB — BETA HCG QUANT (REF LAB): hCG Quant: 80 m[IU]/mL

## 2019-07-12 ENCOUNTER — Telehealth (INDEPENDENT_AMBULATORY_CARE_PROVIDER_SITE_OTHER): Payer: 59 | Admitting: Lactation Services

## 2019-07-12 DIAGNOSIS — O00101 Right tubal pregnancy without intrauterine pregnancy: Secondary | ICD-10-CM

## 2019-07-12 NOTE — Telephone Encounter (Signed)
-----   Message from Donette Larry, [place] sent at 07/12/2019  8:30 AM EDT ----- Good drop. Needs weekly qhcg until negative.

## 2019-07-12 NOTE — Telephone Encounter (Signed)
Patient called and informed of HCG levels. Patient aware she will need to come in weekly until results get to 0. Patient voiced understanding and will be in on Friday for herr next Quant.

## 2019-07-15 ENCOUNTER — Other Ambulatory Visit: Payer: Self-pay | Admitting: *Deleted

## 2019-07-15 DIAGNOSIS — O00109 Unspecified tubal pregnancy without intrauterine pregnancy: Secondary | ICD-10-CM

## 2019-07-16 ENCOUNTER — Other Ambulatory Visit: Payer: Self-pay

## 2019-07-16 ENCOUNTER — Other Ambulatory Visit: Payer: 59

## 2019-07-16 DIAGNOSIS — O00109 Unspecified tubal pregnancy without intrauterine pregnancy: Secondary | ICD-10-CM

## 2019-07-17 LAB — BETA HCG QUANT (REF LAB): hCG Quant: 14 m[IU]/mL

## 2019-07-20 ENCOUNTER — Telehealth: Payer: Self-pay | Admitting: Lactation Services

## 2019-07-20 NOTE — Telephone Encounter (Signed)
Called patient to inform her of her most recent Beta Hcg and to follow up with blood draw on 6/4 at 10:30. Patient did not answer. LM for patient to call the office for results. Will send My Chart message also.

## 2019-07-20 NOTE — Telephone Encounter (Signed)
-----   Message from Donette Larry, [place] sent at 07/17/2019  9:25 AM EDT ----- Good drop, continue weekly qhcg

## 2019-07-22 ENCOUNTER — Other Ambulatory Visit: Payer: Self-pay | Admitting: *Deleted

## 2019-07-22 DIAGNOSIS — O00101 Right tubal pregnancy without intrauterine pregnancy: Secondary | ICD-10-CM

## 2019-07-23 ENCOUNTER — Other Ambulatory Visit: Payer: 59

## 2019-07-23 ENCOUNTER — Other Ambulatory Visit: Payer: Self-pay

## 2019-07-23 DIAGNOSIS — O00101 Right tubal pregnancy without intrauterine pregnancy: Secondary | ICD-10-CM

## 2019-07-24 LAB — BETA HCG QUANT (REF LAB): hCG Quant: <1 m[IU]/mL

## 2019-08-19 ENCOUNTER — Other Ambulatory Visit (HOSPITAL_COMMUNITY)
Admission: RE | Admit: 2019-08-19 | Discharge: 2019-08-19 | Disposition: A | Discharge status: Home / Self Care | Payer: 59 | Source: Ambulatory Visit | Attending: Obstetrics & Gynecology | Admitting: Obstetrics & Gynecology

## 2019-08-19 ENCOUNTER — Ambulatory Visit (INDEPENDENT_AMBULATORY_CARE_PROVIDER_SITE_OTHER): Payer: 59 | Admitting: Obstetrics & Gynecology

## 2019-08-19 ENCOUNTER — Encounter: Payer: Self-pay | Admitting: Obstetrics & Gynecology

## 2019-08-19 ENCOUNTER — Other Ambulatory Visit: Payer: Self-pay

## 2019-08-19 VITALS — BP 122/88 | HR 76 | Ht 67.0 in | Wt 179.0 lb

## 2019-08-19 DIAGNOSIS — Z3040 Encounter for surveillance of contraceptives, unspecified: Secondary | ICD-10-CM

## 2019-08-19 DIAGNOSIS — Z3202 Encounter for pregnancy test, result negative: Secondary | ICD-10-CM | POA: Diagnosis not present

## 2019-08-19 DIAGNOSIS — A64 Unspecified sexually transmitted disease: Secondary | ICD-10-CM | POA: Insufficient documentation

## 2019-08-19 DIAGNOSIS — F419 Anxiety disorder, unspecified: Secondary | ICD-10-CM

## 2019-08-19 DIAGNOSIS — Z30013 Encounter for initial prescription of injectable contraceptive: Secondary | ICD-10-CM

## 2019-08-19 DIAGNOSIS — A599 Trichomoniasis, unspecified: Secondary | ICD-10-CM

## 2019-08-19 DIAGNOSIS — N760 Acute vaginitis: Secondary | ICD-10-CM

## 2019-08-19 DIAGNOSIS — Z733 Stress, not elsewhere classified: Secondary | ICD-10-CM

## 2019-08-19 DIAGNOSIS — B9689 Other specified bacterial agents as the cause of diseases classified elsewhere: Secondary | ICD-10-CM

## 2019-08-19 LAB — POCT PREGNANCY, URINE: Preg Test, Ur: NEGATIVE

## 2019-08-19 MED ORDER — MEDROXYPROGESTERONE ACETATE 150 MG/ML IM SUSP
150.0000 mg | Freq: Once | INTRAMUSCULAR | Status: AC
  Administered 2019-08-19: 150 mg via INTRAMUSCULAR

## 2019-08-19 NOTE — Patient Instructions (Signed)
Hormonal Contraception Information Hormonal contraception is a type of birth control that uses hormones to prevent pregnancy. It usually involves a combination of the hormones estrogen and progesterone or only the hormone progesterone. Hormonal contraception works in these ways:  It thickens the mucus in the cervix, making it harder for sperm to enter the uterus.  It changes the lining of the uterus, making it harder for an egg to implant.  It may stop the ovaries from releasing eggs (ovulation). Some women who take hormonal contraceptives that contain only progesterone may continue to ovulate. Hormonal contraception cannot prevent sexually transmitted infections (STIs). Pregnancy may still occur. Estrogen and progesterone contraceptives Contraceptives that use a combination of estrogen and progesterone are available in these forms:  Pill. Pills come in different combinations of hormones. They must be taken at the same time each day. Pills can affect your period, causing you to get your period once every three months or not at all.  Patch. The patch must be worn on the lower abdomen for three weeks and then removed on the fourth.  Vaginal ring. The ring is placed in the vagina and left there for three weeks. It is then removed for one week. Progesterone contraceptives Contraceptives that use progesterone only are available in these forms:  Pill. Pills should be taken every day of the cycle.  Intrauterine device (IUD). This device is inserted into the uterus and removed or replaced every five years or sooner.  Implant. Plastic rods are placed under the skin of the upper arm. They are removed or replaced every three years or sooner.  Injection. The injection is given once every 90 days. What are the side effects? The side effects of estrogen and progesterone contraceptives include:  Nausea.  Headaches.  Breast tenderness.  Bleeding or spotting between menstrual cycles.  High blood  pressure (rare).  Strokes, heart attacks, or blood clots (rare) Side effects of progesterone-only contraceptives include:  Nausea.  Headaches.  Breast tenderness.  Unpredictable menstrual bleeding.  High blood pressure (rare). Talk to your health care provider about what side effects may affect you. Where to find more information  Ask your health care provider for more information and resources about hormonal contraception.  U.S. Department of Health and Human Services Office on Women's Health: www.womenshealth.gov Questions to ask:  What type of hormonal contraception is right for me?  How long should I plan to use hormonal contraception?  What are the side effects of the hormonal contraception method I choose?  How can I prevent STIs while using hormonal contraception? Contact a health care provider if:  You start taking hormonal contraceptives and you develop persistent or severe side effects. Summary  Estrogen and progesterone are hormones used in many forms of birth control.  Talk to your health care provider about what side effects may affect you.  Hormonal contraception cannot prevent sexually transmitted infections (STIs).  Ask your health care provider for more information and resources about hormonal contraception. This information is not intended to replace advice given to you by your health care provider. Make sure you discuss any questions you have with your health care provider. Document Revised: 06/01/2018 Document Reviewed: 01/05/2016 Elsevier Patient Education  2020 Elsevier Inc.  

## 2019-08-19 NOTE — Progress Notes (Signed)
Patient ID: Christy Hickman, female   DOB: Jun 15, 1980, 39 y.o.   MRN: [id number]  Chief Complaint  Patient presents with  . Contraception    HPI Christy Hickman is a 39 y.o. female.  [id number]  Patient's last menstrual period was 05/05/2019. Patient last took OCP 4 months ago but she had problems with compliance, so she wants to start DMPA. Her questions were answered. S/p possible ectopic 06/2019, MTX HPI  Past Medical History:  Diagnosis Date  . Ectopic pregnancy 06/2019   Methotrexate  . Medical history non-contributory     Past Surgical History:  Procedure Laterality Date  . NO PAST SURGERIES      Family History  Problem Relation Age of Onset  . Healthy Mother   . Healthy Father     Social History Social History   Tobacco Use  . Smoking status: Current Every Day Smoker    Packs/day: 0.50    Years: 24.00    Pack years: 12.00    Types: Cigarettes  . Smokeless tobacco: Never Used  Vaping Use  . Vaping Use: Never used  Substance Use Topics  . Alcohol use: Not Currently  . Drug use: Never    No Known Allergies  Current Outpatient Medications  Medication Sig Dispense Refill  . butalbital-acetaminophen-caffeine (FIORICET) 50-325-40 MG tablet Take 1-2 tablets by mouth every 6 (six) hours as needed for headache. (Patient not taking: Reported on 08/19/2019) 14 tablet 0   No current facility-administered medications for this visit.    Review of Systems Review of Systems  Constitutional: Negative.   Respiratory: Negative.   Genitourinary: Positive for menstrual problem. Negative for vaginal discharge.  Psychiatric/Behavioral: Positive for dysphoric mood. The patient is nervous/anxious.     Blood pressure 122/88, pulse 76, height 5\' 7"  (1.702 m), weight 179 lb (81.2 kg), last menstrual period 05/05/2019, unknown if currently breastfeeding.  Physical Exam Physical Exam Constitutional:      Appearance: Normal appearance.  Pulmonary:     Effort: Pulmonary effort is  normal.  Neurological:     General: No focal deficit present.     Mental Status: She is alert.  Psychiatric:        Mood and Affect: Mood normal.        Behavior: Behavior normal.     Data Reviewed Treatment for pregnancy unknown location 06/2019 MTX  Assessment May begin DMPA Q3 mo  Anxiety, stress S/P MTX, beta HCG follow to negative Plan Offered counseling referral RTC 3 mo for DMPA    07/2019 08/19/2019, 9:34 AM

## 2019-08-20 LAB — CERVICOVAGINAL ANCILLARY ONLY
Bacterial Vaginitis (gardnerella): POSITIVE — AB
Candida Glabrata: NEGATIVE
Candida Vaginitis: NEGATIVE
Chlamydia: NEGATIVE
Comment: NEGATIVE
Comment: NEGATIVE
Comment: NEGATIVE
Comment: NEGATIVE
Comment: NEGATIVE
Comment: NORMAL
Neisseria Gonorrhea: NEGATIVE
Trichomonas: POSITIVE — AB

## 2019-08-21 ENCOUNTER — Other Ambulatory Visit: Payer: Self-pay | Admitting: Obstetrics & Gynecology

## 2019-08-21 DIAGNOSIS — A64 Unspecified sexually transmitted disease: Secondary | ICD-10-CM

## 2019-08-21 MED ORDER — METRONIDAZOLE 500 MG PO TABS: 500.0000 mg | ORAL_TABLET | Freq: Two times a day (BID) | ORAL | 0 refills | Status: AC

## 2019-08-21 NOTE — Progress Notes (Unsigned)
Flagyl for BV and trichomonas

## 2019-11-03 DIAGNOSIS — Z3041 Encounter for surveillance of contraceptive pills: Secondary | ICD-10-CM

## 2019-11-04 ENCOUNTER — Ambulatory Visit: Payer: 59

## 2019-11-16 MED ORDER — DROSPIRENONE-ETHINYL ESTRADIOL 3-0.03 MG PO TABS: 1.0000 | ORAL_TABLET | Freq: Every day | ORAL | 11 refills | Status: AC

## 2021-02-15 ENCOUNTER — Ambulatory Visit: Payer: 59 | Admitting: Podiatry

## 2021-02-20 ENCOUNTER — Other Ambulatory Visit: Payer: Self-pay

## 2021-02-20 ENCOUNTER — Ambulatory Visit: Payer: 59 | Admitting: Podiatry

## 2021-02-20 DIAGNOSIS — M79672 Pain in left foot: Secondary | ICD-10-CM

## 2021-02-20 DIAGNOSIS — M7742 Metatarsalgia, left foot: Secondary | ICD-10-CM | POA: Diagnosis not present

## 2021-02-20 DIAGNOSIS — M79671 Pain in right foot: Secondary | ICD-10-CM | POA: Diagnosis not present

## 2021-02-20 DIAGNOSIS — M7741 Metatarsalgia, right foot: Secondary | ICD-10-CM | POA: Diagnosis not present

## 2021-02-20 DIAGNOSIS — L84 Corns and callosities: Secondary | ICD-10-CM

## 2021-02-25 NOTE — Progress Notes (Signed)
Subjective:   Patient ID: Christy Hickman, female   DOB: 41 y.o.   MRN: [id number]   HPI 41 year old female presents the office today for concerns of calluses to her right foot worse than left.  This is been ongoing for last 9 years and she states that she has a family history of calluses and she wants to try to prevent it from getting worse.  She does try to file them herself.  She gets pain 5/10 described as stabbing pain with the calluses get thick.  On the left side the callus started about 1 year ago.  No open sores that she reports.   Review of Systems  All other systems reviewed and are negative.  Past Medical History:  Diagnosis Date   Ectopic pregnancy 06/2019   Methotrexate   Medical history non-contributory     Past Surgical History:  Procedure Laterality Date   NO PAST SURGERIES       Current Outpatient Medications:    butalbital-acetaminophen-caffeine (FIORICET) 50-325-40 MG tablet, Take 1-2 tablets by mouth every 6 (six) hours as needed for headache. (Patient not taking: Reported on 08/19/2019), Disp: 14 tablet, Rfl: 0   drospirenone-ethinyl estradiol (ZARAH) 3-0.03 MG tablet, Take 1 tablet by mouth daily., Disp: 28 tablet, Rfl: 11   metroNIDAZOLE (FLAGYL) 500 MG tablet, Take 1 tablet (500 mg total) by mouth 2 (two) times daily., Disp: 14 tablet, Rfl: 0  No Known Allergies        Objective:  Physical Exam  General: AAO x3, NAD  Dermatological: Hyperkeratotic lesions present submetatarsal bilaterally.  No underlying ulceration drainage or any signs of infection noted today.  Vascular: Dorsalis Pedis artery and Posterior Tibial artery pedal pulses are 2/4 bilateral with immedate capillary fill time.  There is no pain with calf compression, swelling, warmth, erythema.   Neruologic: Grossly intact via light touch bilateral.   Musculoskeletal: Tenderness on the calluses plantarly.  No other area discomfort.  Mild bunions are present.  Muscular strength 5/5 in all  groups tested bilateral.  Gait: Unassisted, Nonantalgic.       Assessment:   Hyperkeratotic lesions, metatarsalgia     Plan:  -Treatment options discussed including all alternatives, risks, and complications -Etiology of symptoms were discussed -Sharply debrided the hyperkeratotic lesions without any complications or bleeding.  Discussion modifications and arch support with offloading.  I am going to order compound cream through [facility] for the calluses.  Vivi Barrack DPM

## 2021-02-27 ENCOUNTER — Other Ambulatory Visit: Payer: Self-pay

## 2021-05-22 ENCOUNTER — Ambulatory Visit: Payer: 59 | Admitting: Podiatry

## 2021-07-06 IMAGING — US US OB TRANSVAGINAL
1 series · 15 of 28 positions shown · non-contrast
Comparison: None.

CLINICAL DATA: Vaginal bleeding and pain. Quantitative beta HCG is
[DATE]. Gestational age by last menstrual period is 9 weeks 5 days.

EXAM:
TRANSVAGINAL OB ULTRASOUND
TECHNIQUE: Transvaginal ultrasound was performed for complete evaluation of the
gestation as well as the maternal uterus, adnexal regions, and
pelvic cul-de-sac.

[Series 1: us ob transvaginal · 15 of 66 slices shown]
[im 1/66]
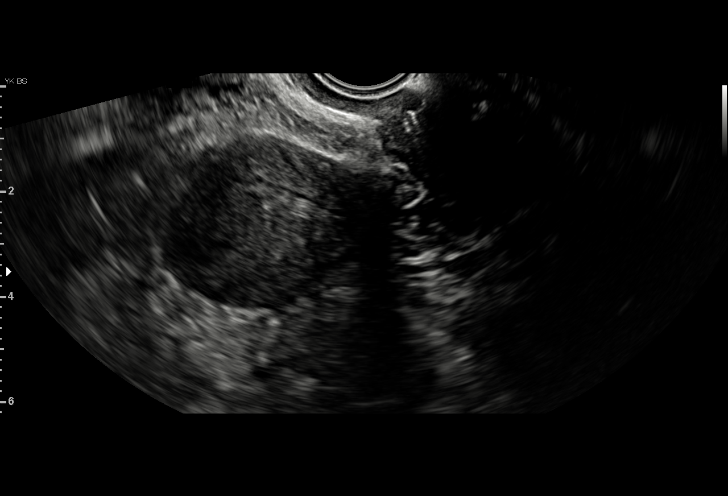
[im 5/66]
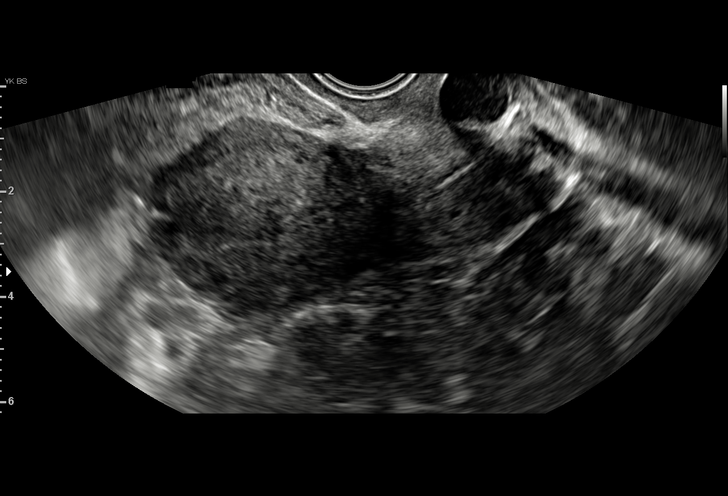
[im 10/66]
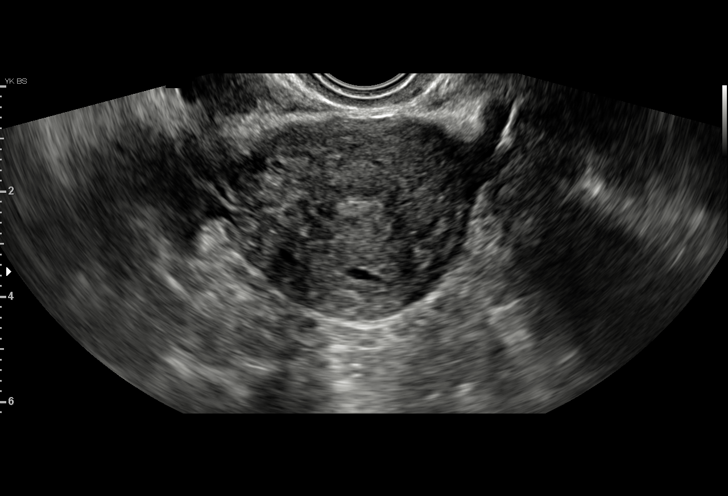
[im 15/66]
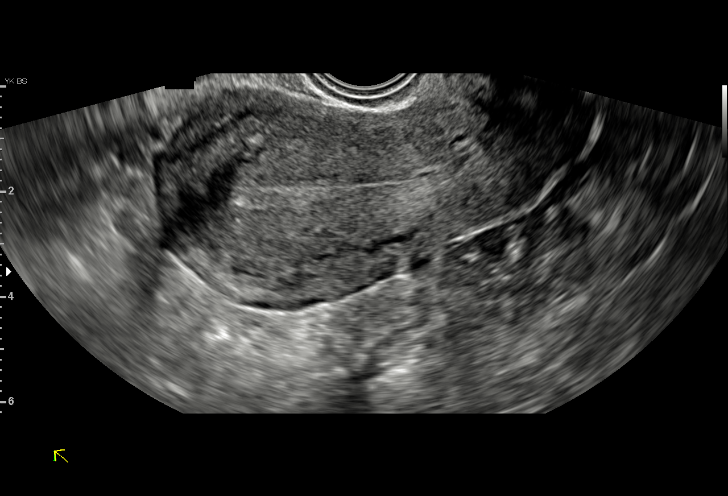
[im 20/66]
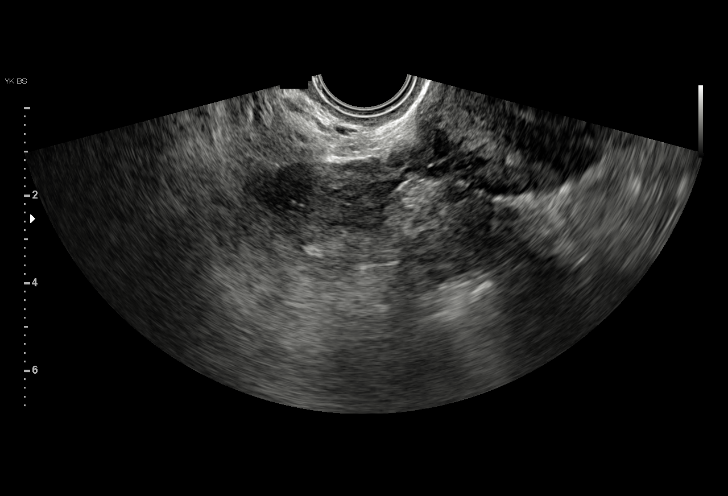
[im 25/66]
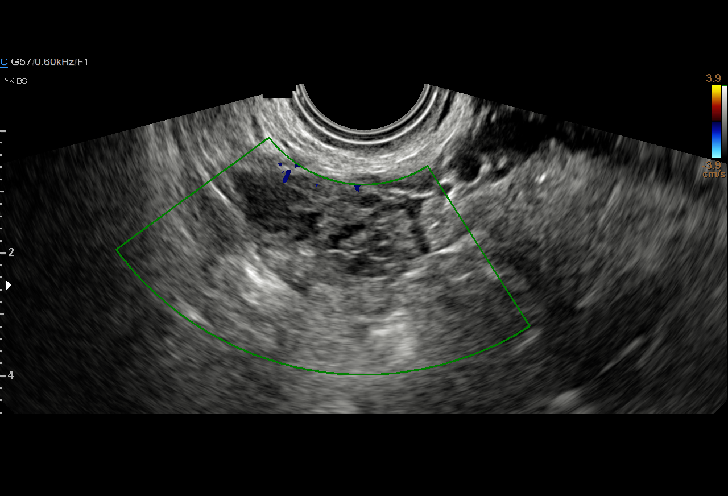
[im 29/66]
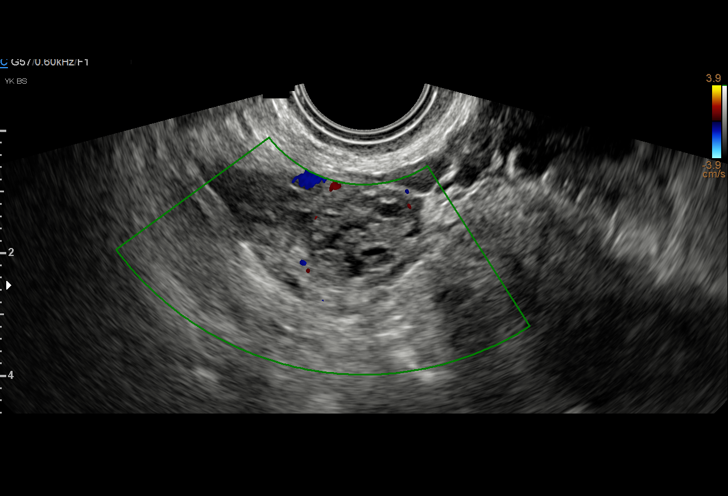
[im 34/66]
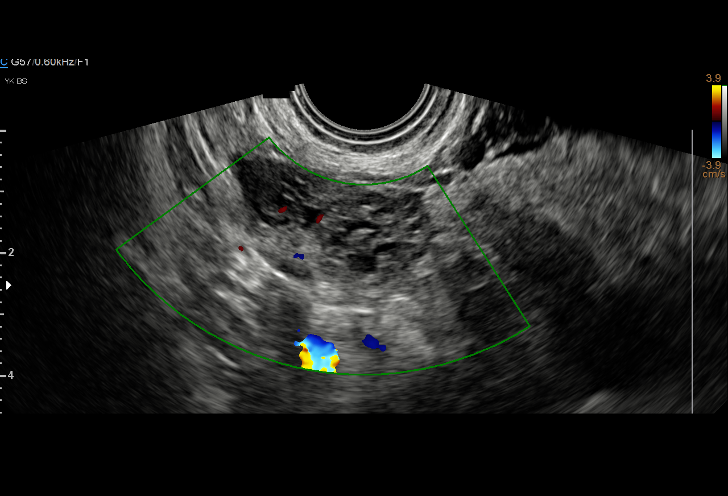
[im 37/66]
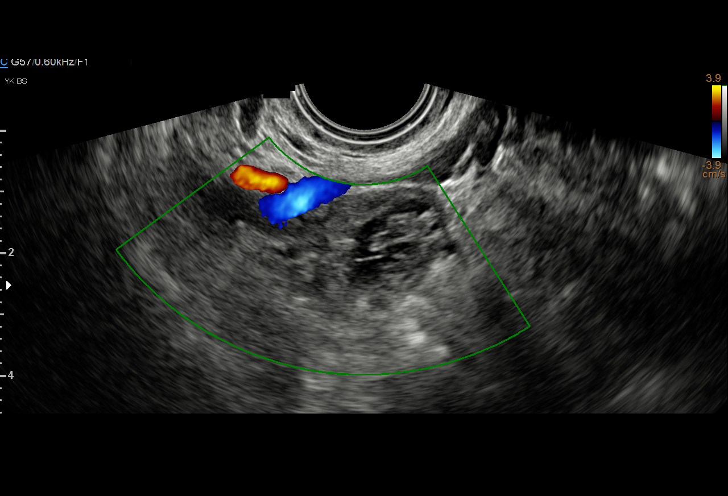
[im 41/66]
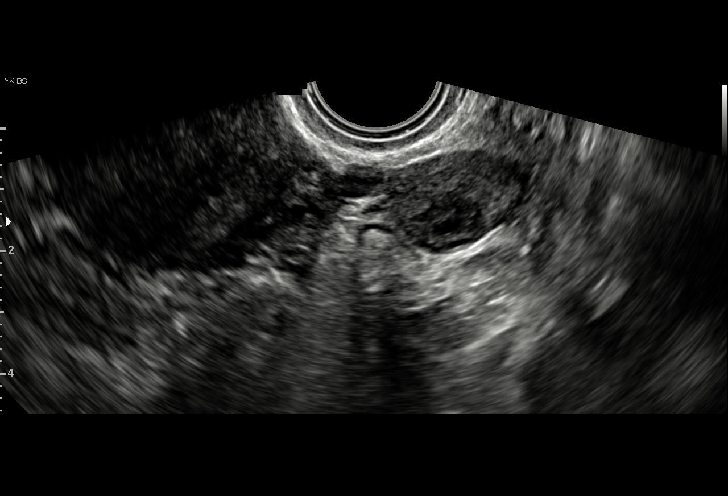
[im 46/66]
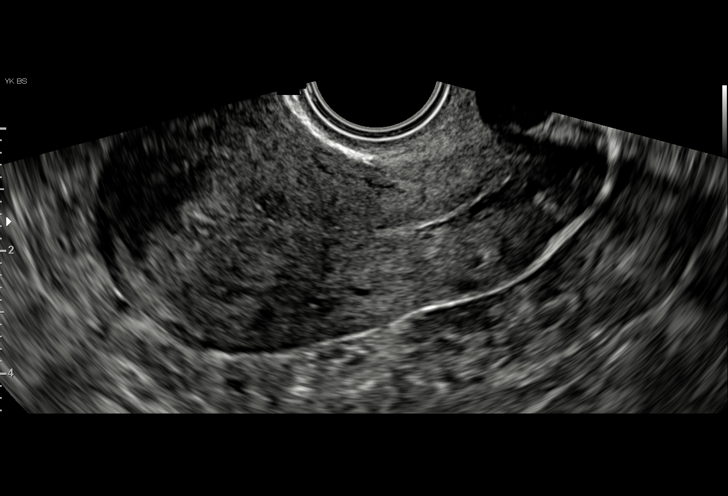
[im 51/66]
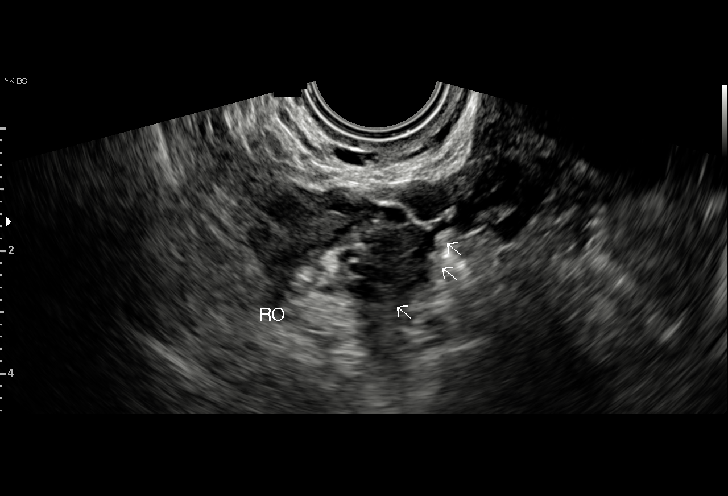
[im 56/66]
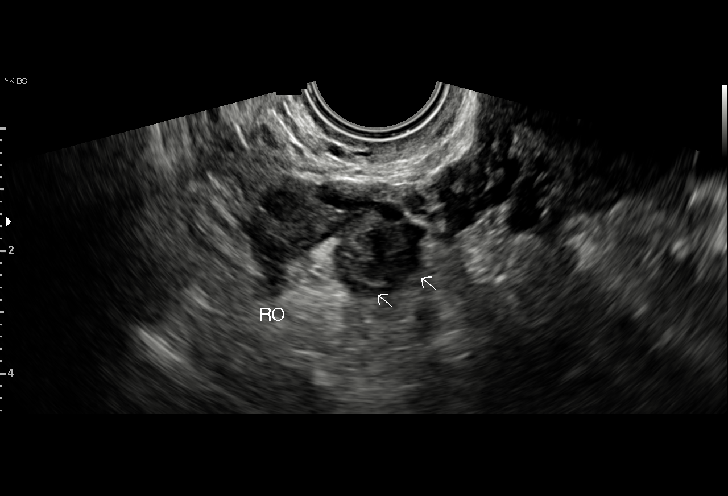
[im 61/66]
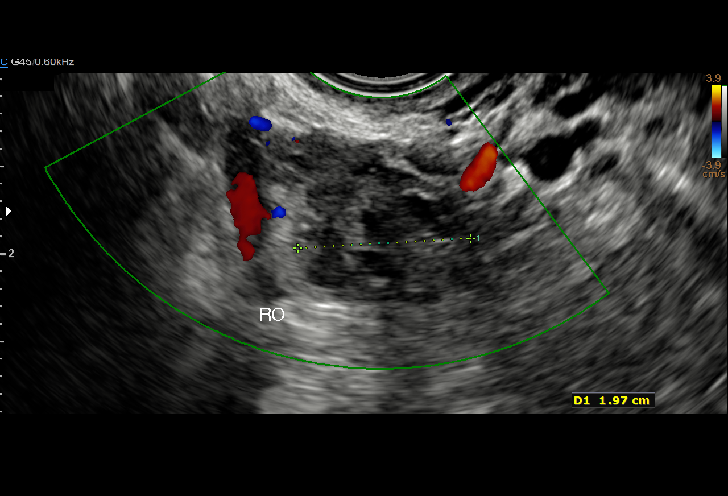
[im 66/66]
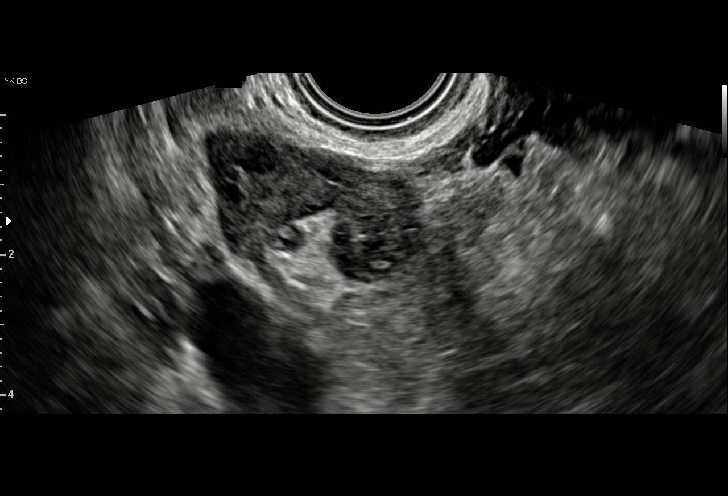

[15 of 28 positions shown; findings below may reference images not displayed]

FINDINGS: Intrauterine gestational sac: None

Yolk sac:  Not Visualized.

Embryo:  Not Visualized.

Subchorionic hemorrhage:  None visualized.

Maternal uterus/adnexae: An oval heterogeneous mass next to the
right ovary measures 2.4 x 2.0 x 2.0 cm and appears separate from
the ovary on exam. No definite gestational sac is identified within
this mass. Both ovaries appear normal. No free intraperitoneal fluid
is identified.
IMPRESSION: Heterogeneous right adnexal mass measuring 2.4 cm appears separate
from the right ovary. No intrauterine pregnancy is identified. These
findings are concerning for ectopic pregnancy.

These results were called by telephone at the time of interpretation
on 06/26/2019 at [DATE] to provider VINI TRILLO CNM, who
verbally acknowledged these results.

## 2021-07-12 IMAGING — US US OB TRANSVAGINAL
1 series · 15 of 28 positions shown · non-contrast
Comparison: June 26, 2019.

CLINICAL DATA: Possible ectopic pregnancy.

EXAM:
TRANSVAGINAL OB ULTRASOUND
TECHNIQUE: Transvaginal ultrasound was performed for complete evaluation of the
gestation as well as the maternal uterus, adnexal regions, and
pelvic cul-de-sac.

[Series 1: us ob transvaginal · 54 acquisitions, 15 frames shown]
[im 1/54]
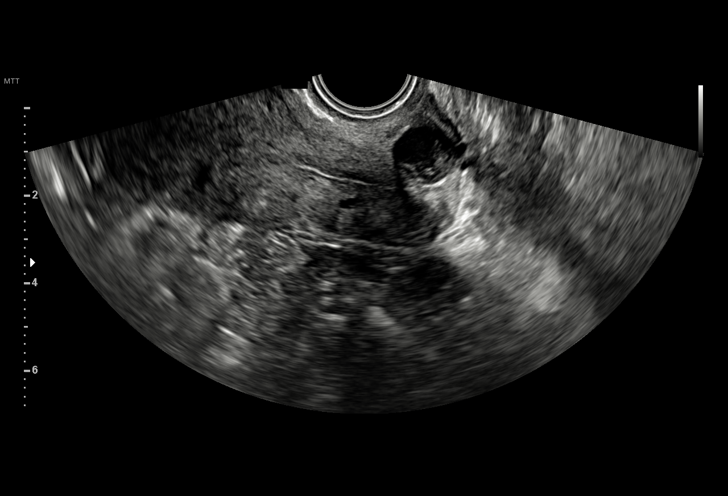
[im 4/54]
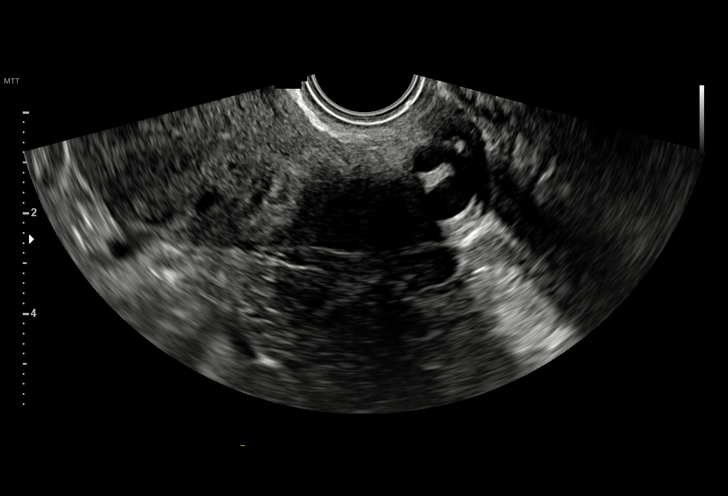
[im 8/54]
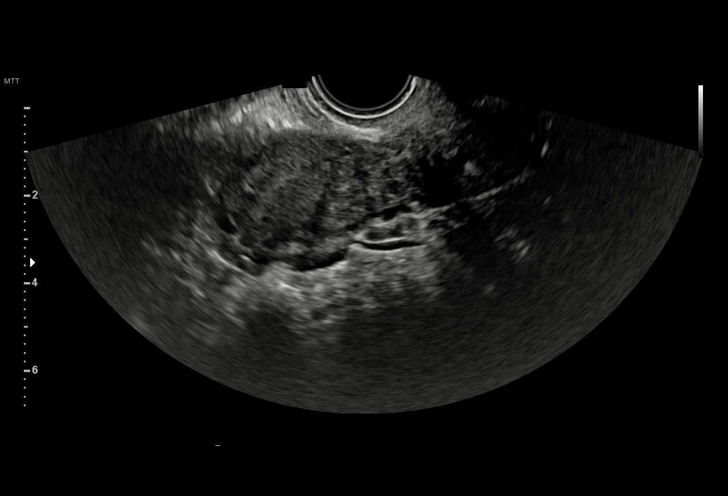
[im 12/54]
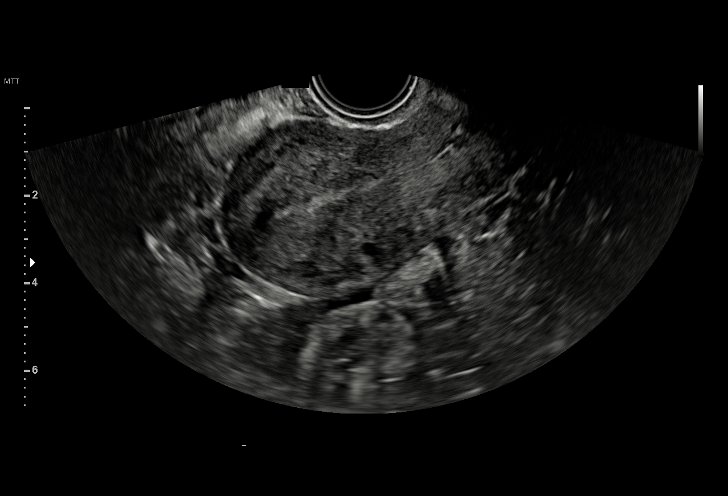
[im 16/54]
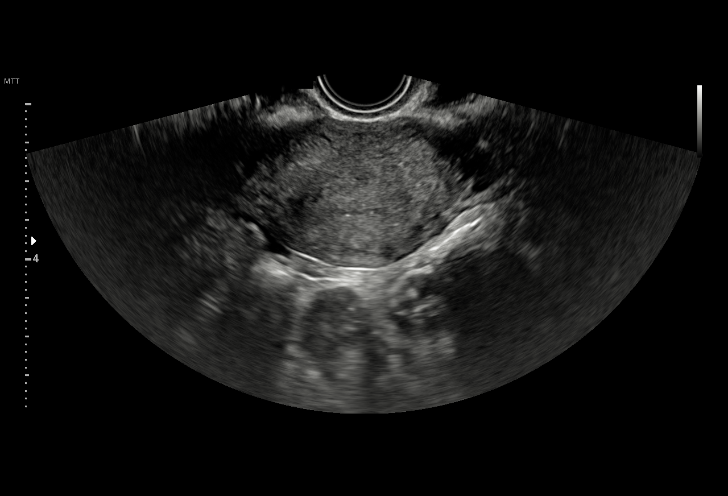
[im 20/54]
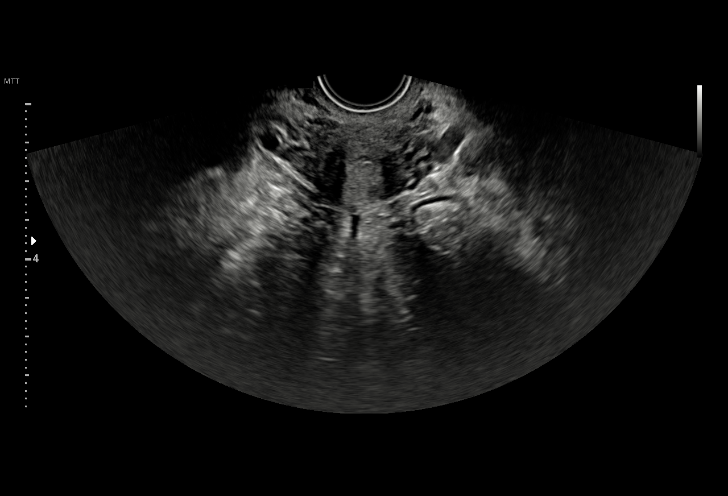
[im 24/54]
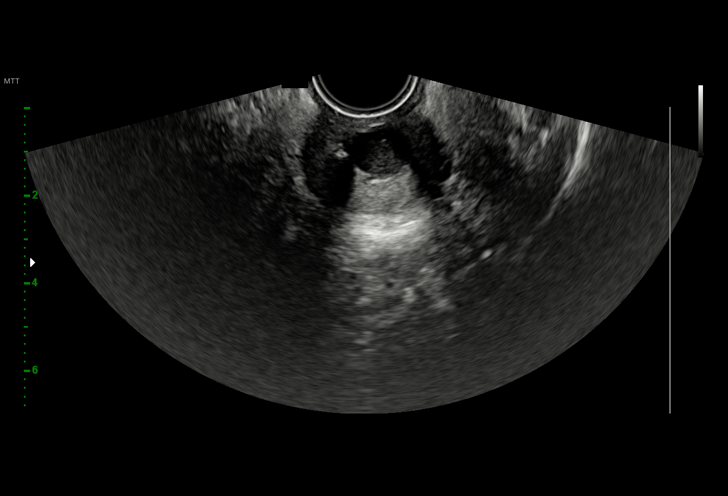
[im 28/54]
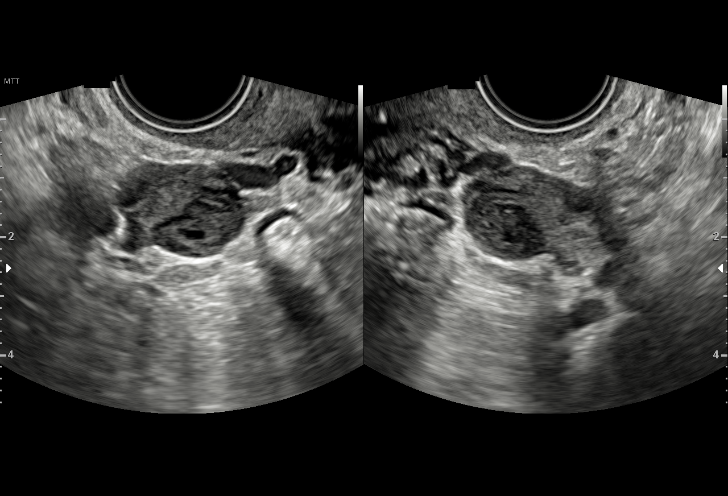
[im 30/54]
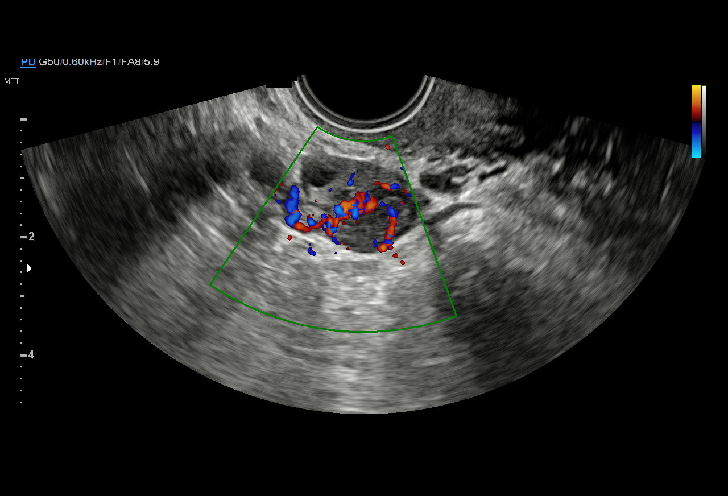
[im 34/54]
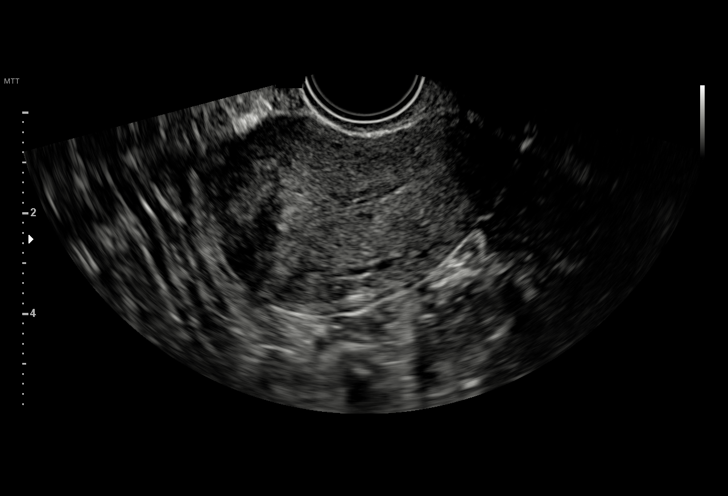
[im 38/54]
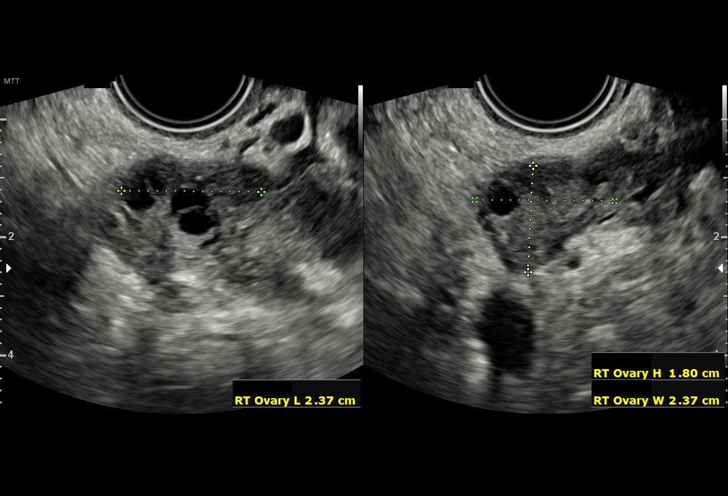
[im 42/54]
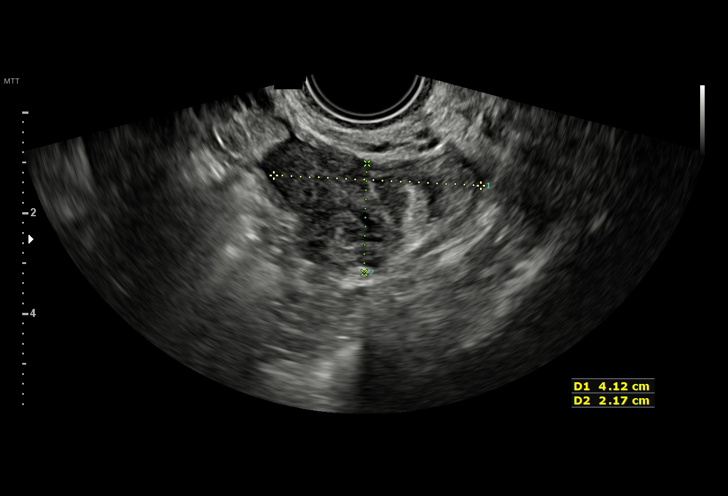
[im 46/54]
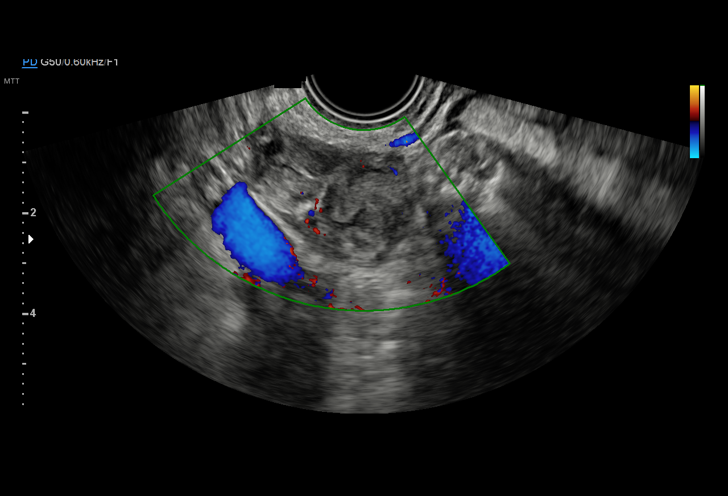
[im 50/54]
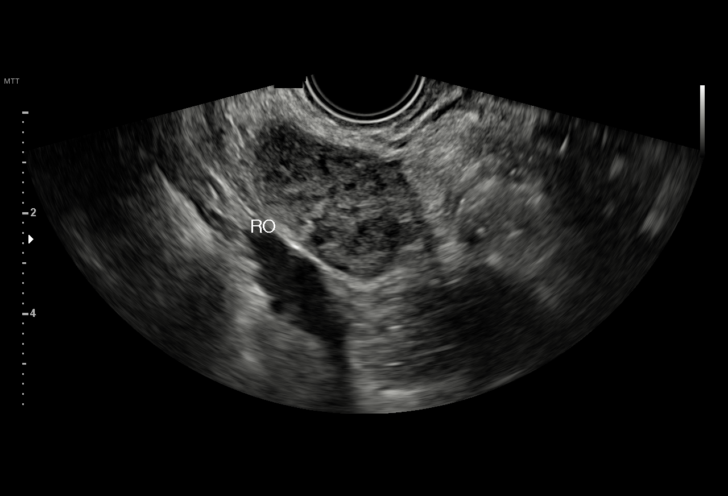
[im 54/54]
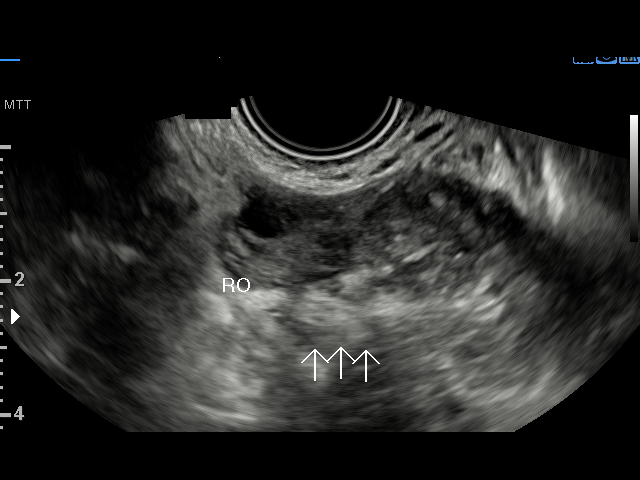

[15 of 28 positions shown; findings below may reference images not displayed]

FINDINGS: Intrauterine gestational sac: None

Yolk sac:  Not Visualized.

Embryo:  Not Visualized.

Cardiac Activity: Not Visualized.

Subchorionic hemorrhage:  None visualized.

Maternal uterus/adnexae: Ovaries are unremarkable. Trace free fluid
is noted. Heterogeneous mass is seen adjacent to right ovary that
measures 2.3 x 2.0 cm by my own measurement and appears to be
slightly enlarged compared to prior exam. It does not demonstrate
significantly increased vascularity on Doppler.
IMPRESSION: No evidence of intrauterine gestation is noted. 2.3 x 2.0 cm
heterogeneous right adnexal mass is seen adjacent to right ovary
which may be slightly enlarged compared to prior exam. This remains
suspicious for ectopic pregnancy. Reportedly the patient has already
received methotrexate therapy. A significant amount of pelvic free
fluid is not identified. These results were called by telephone at
the time of interpretation on 07/02/2019 at [DATE] to provider
DRAGAN ARZATE , who verbally acknowledged these results.

## 2022-02-19 DIAGNOSIS — R7303 Prediabetes: Secondary | ICD-10-CM | POA: Diagnosis not present

## 2022-02-19 DIAGNOSIS — Z124 Encounter for screening for malignant neoplasm of cervix: Secondary | ICD-10-CM | POA: Diagnosis not present

## 2022-02-19 DIAGNOSIS — Z0000 Encounter for general adult medical examination without abnormal findings: Secondary | ICD-10-CM | POA: Diagnosis not present

## 2022-02-19 DIAGNOSIS — F17200 Nicotine dependence, unspecified, uncomplicated: Secondary | ICD-10-CM | POA: Diagnosis not present

## 2022-02-19 DIAGNOSIS — Z202 Contact with and (suspected) exposure to infections with a predominantly sexual mode of transmission: Secondary | ICD-10-CM | POA: Diagnosis not present

## 2022-02-19 DIAGNOSIS — E559 Vitamin D deficiency, unspecified: Secondary | ICD-10-CM | POA: Diagnosis not present

## 2022-02-19 DIAGNOSIS — N760 Acute vaginitis: Secondary | ICD-10-CM | POA: Diagnosis not present

## 2022-02-27 DIAGNOSIS — Z3009 Encounter for other general counseling and advice on contraception: Secondary | ICD-10-CM | POA: Diagnosis not present

## 2022-08-29 DIAGNOSIS — L0291 Cutaneous abscess, unspecified: Secondary | ICD-10-CM | POA: Diagnosis not present

## 2022-08-29 DIAGNOSIS — K219 Gastro-esophageal reflux disease without esophagitis: Secondary | ICD-10-CM | POA: Diagnosis not present

## 2022-10-03 DIAGNOSIS — E669 Obesity, unspecified: Secondary | ICD-10-CM | POA: Diagnosis not present

## 2022-10-03 DIAGNOSIS — Z113 Encounter for screening for infections with a predominantly sexual mode of transmission: Secondary | ICD-10-CM | POA: Diagnosis not present

## 2022-10-03 DIAGNOSIS — N898 Other specified noninflammatory disorders of vagina: Secondary | ICD-10-CM | POA: Diagnosis not present

## 2022-10-03 DIAGNOSIS — R5383 Other fatigue: Secondary | ICD-10-CM | POA: Diagnosis not present

## 2022-10-22 DIAGNOSIS — N898 Other specified noninflammatory disorders of vagina: Secondary | ICD-10-CM | POA: Diagnosis not present

## 2022-10-22 DIAGNOSIS — E669 Obesity, unspecified: Secondary | ICD-10-CM | POA: Diagnosis not present

## 2022-10-22 DIAGNOSIS — Z713 Dietary counseling and surveillance: Secondary | ICD-10-CM | POA: Diagnosis not present

## 2022-10-22 DIAGNOSIS — Z6830 Body mass index (BMI) 30.0-30.9, adult: Secondary | ICD-10-CM | POA: Diagnosis not present

## 2022-11-01 DIAGNOSIS — N898 Other specified noninflammatory disorders of vagina: Secondary | ICD-10-CM | POA: Diagnosis not present

## 2022-11-01 DIAGNOSIS — J069 Acute upper respiratory infection, unspecified: Secondary | ICD-10-CM | POA: Diagnosis not present

## 2022-11-04 DIAGNOSIS — N898 Other specified noninflammatory disorders of vagina: Secondary | ICD-10-CM | POA: Diagnosis not present

## 2022-11-04 DIAGNOSIS — R399 Unspecified symptoms and signs involving the genitourinary system: Secondary | ICD-10-CM | POA: Diagnosis not present

## 2022-11-04 DIAGNOSIS — N390 Urinary tract infection, site not specified: Secondary | ICD-10-CM | POA: Diagnosis not present

## 2023-05-15 DIAGNOSIS — R140 Abdominal distension (gaseous): Secondary | ICD-10-CM | POA: Diagnosis not present

## 2023-05-15 DIAGNOSIS — K5909 Other constipation: Secondary | ICD-10-CM | POA: Diagnosis not present

## 2023-05-15 DIAGNOSIS — Z23 Encounter for immunization: Secondary | ICD-10-CM | POA: Diagnosis not present

## 2023-05-15 DIAGNOSIS — E559 Vitamin D deficiency, unspecified: Secondary | ICD-10-CM | POA: Diagnosis not present

## 2023-05-15 DIAGNOSIS — Z113 Encounter for screening for infections with a predominantly sexual mode of transmission: Secondary | ICD-10-CM | POA: Diagnosis not present

## 2023-05-15 DIAGNOSIS — L089 Local infection of the skin and subcutaneous tissue, unspecified: Secondary | ICD-10-CM | POA: Diagnosis not present

## 2023-05-15 DIAGNOSIS — Z0000 Encounter for general adult medical examination without abnormal findings: Secondary | ICD-10-CM | POA: Diagnosis not present

## 2023-06-27 DIAGNOSIS — E66811 Obesity, class 1: Secondary | ICD-10-CM | POA: Diagnosis not present
